# Patient Record
Sex: Male | Born: 1970 | Race: White | Hispanic: No | Marital: Single | Smoking: Never smoker
Health system: Southern US, Community
[De-identification: ages and names within clinical notes are randomized; demographics above are authoritative.]

## PROBLEM LIST (undated history)

## (undated) DIAGNOSIS — T7840XA Allergy, unspecified, initial encounter: Secondary | ICD-10-CM

## (undated) HISTORY — PX: APPENDECTOMY: SHX54

## (undated) HISTORY — PX: TIBIA FRACTURE SURGERY: SHX806

## (undated) HISTORY — PX: HERNIA REPAIR: SHX51

## (undated) HISTORY — DX: Allergy, unspecified, initial encounter: T78.40XA

## (undated) HISTORY — PX: SHOULDER SURGERY: SHX246

---

## 2003-01-17 ENCOUNTER — Encounter: Admission: RE | Admit: 2003-01-17 | Discharge: 2003-01-17 | Payer: Self-pay | Admitting: Family Medicine

## 2004-09-13 ENCOUNTER — Ambulatory Visit: Payer: Self-pay | Admitting: Family Medicine

## 2004-09-20 ENCOUNTER — Ambulatory Visit: Payer: Self-pay | Admitting: Family Medicine

## 2005-05-14 ENCOUNTER — Ambulatory Visit: Payer: Self-pay | Admitting: Family Medicine

## 2005-05-23 ENCOUNTER — Encounter: Admission: RE | Admit: 2005-05-23 | Discharge: 2005-05-23 | Payer: Self-pay | Admitting: Orthopedic Surgery

## 2007-02-13 DIAGNOSIS — S0083XA Contusion of other part of head, initial encounter: Secondary | ICD-10-CM

## 2007-02-13 DIAGNOSIS — S0003XA Contusion of scalp, initial encounter: Secondary | ICD-10-CM | POA: Insufficient documentation

## 2007-02-13 DIAGNOSIS — S1093XA Contusion of unspecified part of neck, initial encounter: Secondary | ICD-10-CM

## 2007-02-16 ENCOUNTER — Ambulatory Visit: Payer: Self-pay | Admitting: Family Medicine

## 2007-02-16 DIAGNOSIS — J309 Allergic rhinitis, unspecified: Secondary | ICD-10-CM | POA: Insufficient documentation

## 2008-08-24 ENCOUNTER — Encounter: Payer: Self-pay | Admitting: Family Medicine

## 2008-09-05 ENCOUNTER — Telehealth: Payer: Self-pay | Admitting: Family Medicine

## 2008-09-05 DIAGNOSIS — N469 Male infertility, unspecified: Secondary | ICD-10-CM | POA: Insufficient documentation

## 2009-06-14 ENCOUNTER — Ambulatory Visit: Payer: Self-pay | Admitting: Family Medicine

## 2009-06-14 DIAGNOSIS — H9319 Tinnitus, unspecified ear: Secondary | ICD-10-CM | POA: Insufficient documentation

## 2009-11-07 ENCOUNTER — Ambulatory Visit: Payer: Self-pay | Admitting: Family Medicine

## 2009-11-07 DIAGNOSIS — J45909 Unspecified asthma, uncomplicated: Secondary | ICD-10-CM | POA: Insufficient documentation

## 2010-01-27 ENCOUNTER — Encounter: Payer: Self-pay | Admitting: Family Medicine

## 2010-02-06 NOTE — Assessment & Plan Note (Signed)
Summary: ringing in rt ear/pressure/cjr   Vital Signs:  Patient profile:   40 year old male Weight:      165 pounds Temp:     98.8 degrees F oral BP sitting:   140 / 90  (left arm)  Vitals Entered By: Kathrynn Speed CMA (June 14, 2009 9:27 AM)   History of Present Illness: Troy Mercer is a 40 year old male, who comes in today for evaluation of ringing in his right ear.  He awoke at 4 a.m. this morning to urinate and noticed some ringing in his ear.  He went back to sleep woke up this morning, and the ringing persists.  He describes as a high-pitched noise.  He says his hearing is somewhat muffled but seems to be normal.  No history of previous hearing loss 10 at this trauma, etc.  Current Medications (verified): 1)  None  Allergies (verified): 1)  ! Pcn 2)  Codeine  Past History:  Past medical, surgical, family and social histories (including risk factors) reviewed for relevance to current acute and chronic problems.  Past Medical History: Reviewed history from 02/16/2007 and no changes required. Allergic rhinitis  Past Surgical History: Reviewed history from 02/16/2007 and no changes required. Appendectomy r  herniorrhaphy hx of spiralfracture- DISTAL L TIBIA  Family History: Reviewed history from 02/16/2007 and no changes required. Family History High cholesterol Family History Hypertension Family History of Alcoholism-SMOKING Family History Thyroidectomy DDD OBESE DIVERTICULITIS OA  Social History: Reviewed history and no changes required.  Review of Systems      See HPI  Physical Exam  General:  Well-developed,well-nourished,in no acute distress; alert,appropriate and cooperative throughout examination Ears:  External ear exam shows no significant lesions or deformities.  Otoscopic examination reveals clear canals, tympanic membranes are intact bilaterally without bulging, retraction, inflammation or discharge. Hearing is grossly normal  bilaterally.   Problems:  Medical Problems Added: 1)  Dx of Tinnitus, Right  (ICD-388.30)  Impression & Recommendations:  Problem # 1:  TINNITUS, RIGHT (ICD-388.30) Assessment New  Patient Instructions: 1)  if the tennitus does  not resolved in a couple days or u   noticed changing your hearing.  Call Dr. Narda Bonds immediately for consultation

## 2010-02-06 NOTE — Assessment & Plan Note (Signed)
Summary: chest cod//lch   Vital Signs:  Patient profile:   40 year old male Weight:      165 pounds Temp:     98.7 degrees F oral BP sitting:   120 / 84  (left arm) Cuff size:   regular  Vitals Entered By: Kern Reap CMA Duncan Dull) (November 07, 2009 10:54 AM) CC: chest congestion   CC:  chest congestion.  History of Present Illness: Troy Mercer is a 40 year old male, nonsmoker, who comes in today with a 8 day history of coughing.  He states he has allergic rhinitis and always has problems in the fall.  He has a lot of sensitivity to ragweed.  About 8 days ago he developed chest congestion and coughing.  He also feels like he is wheezing.  Review of systems otherwise negative  Allergies: 1)  ! Pcn 2)  Codeine  Past History:  Past medical, surgical, family and social histories (including risk factors) reviewed for relevance to current acute and chronic problems.  Past Medical History: Reviewed history from 02/16/2007 and no changes required. Allergic rhinitis  Past Surgical History: Reviewed history from 02/16/2007 and no changes required. Appendectomy r  herniorrhaphy hx of spiralfracture- DISTAL L TIBIA  Family History: Reviewed history from 02/16/2007 and no changes required. Family History High cholesterol Family History Hypertension Family History of Alcoholism-SMOKING Family History Thyroidectomy DDD OBESE DIVERTICULITIS OA  Social History: Reviewed history and no changes required.  Review of Systems      See HPI  Physical Exam  General:  Well-developed,well-nourished,in no acute distress; alert,appropriate and cooperative throughout examination Head:  Normocephalic and atraumatic without obvious abnormalities. No apparent alopecia or balding. Eyes:  No corneal or conjunctival inflammation noted. EOMI. Perrla. Funduscopic exam benign, without hemorrhages, exudates or papilledema. Vision grossly normal. Ears:  External ear exam shows no significant lesions or  deformities.  Otoscopic examination reveals clear canals, tympanic membranes are intact bilaterally without bulging, retraction, inflammation or discharge. Hearing is grossly normal bilaterally. Nose:  External nasal examination shows no deformity or inflammation. Nasal mucosa are pink and moist without lesions or exudates. Mouth:  Oral mucosa and oropharynx without lesions or exudates.  Teeth in good repair. Neck:  No deformities, masses, or tenderness noted. Chest Wall:  No deformities, masses, tenderness or gynecomastia noted. Lungs:  symmetrical breath sounds delayed expiratory wheezing on forced expiration   Problems:  Medical Problems Added: 1)  Dx of Asthma  (ICD-493.90)  Impression & Recommendations:  Problem # 1:  ASTHMA (ICD-493.90) Assessment New  His updated medication list for this problem includes:    Prednisone 20 Mg Tabs (Prednisone) ..... Uad  Complete Medication List: 1)  Prednisone 20 Mg Tabs (Prednisone) .... Uad  Patient Instructions: 1)  begin prednisone two tabs x 3 days, one x 3 days, a half x 3 days, then half a tablet Monday, Wednesday, Friday, for a two-week taper.  Return p.r.n. Prescriptions: PREDNISONE 20 MG TABS (PREDNISONE) UAD  #30 x 1   Entered and Authorized by:   Roderick Pee MD   Signed by:   Roderick Pee MD on 11/07/2009   Method used:   Print then Give to Patient   RxID:   [id number]    Orders Added: 1)  Est. Patient Level III [30865]

## 2010-02-06 NOTE — Progress Notes (Signed)
Summary: PLEASE ADVISE OF LABS  Phone Note Call from Patient Call back at Home Phone [phone]   Caller: Patient-LIVE CALL Reason for Call: Talk to Nurse Summary of Call: LABS WERE FAXED HERE FOR DR TODD TOLD TO VIEW. PLEASE ADVISE HIM OF WHAT HE NEEDS TO DO? WANTS RACHEL OR DR TODD TO CALL HIM TODAY. Initial call taken by: Warnell Forester,  September 05, 2008 9:51 AM  Follow-up for Phone Call        pls. 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 per dr todd Follow-up by: Kern Reap CMA Duncan Dull),  September 06, 2008 12:20 PM  Additional Follow-up for Phone Call Additional follow up Details #3:: Details for Additional Follow-up Action Taken: patient is aware Additional Follow-up by: Kern Reap CMA (AAMA),  September 06, 2008 12:21 PM  New Problems: MALE INFERTILITY (ICD-606.9)

## 2011-01-24 ENCOUNTER — Encounter: Payer: Self-pay | Admitting: Family Medicine

## 2011-01-24 ENCOUNTER — Ambulatory Visit (INDEPENDENT_AMBULATORY_CARE_PROVIDER_SITE_OTHER): Payer: BC Managed Care – PPO | Admitting: Family Medicine

## 2011-01-24 VITALS — BP 110/80 | Temp 98.2°F | Ht 68.0 in | Wt 165.0 lb

## 2011-01-24 DIAGNOSIS — K140 Glossitis: Secondary | ICD-10-CM

## 2011-01-24 LAB — CBC WITH DIFFERENTIAL/PLATELET
Basophils Absolute: 0 10*3/uL (ref 0.0–0.1)
Basophils Relative: 0.3 % (ref 0.0–3.0)
Eosinophils Absolute: 0.1 10*3/uL (ref 0.0–0.7)
Eosinophils Relative: 1.1 % (ref 0.0–5.0)
HCT: 39.2 % (ref 39.0–52.0)
Hemoglobin: 13.4 g/dL (ref 13.0–17.0)
Lymphocytes Relative: 20.3 % (ref 12.0–46.0)
Lymphs Abs: 1.5 10*3/uL (ref 0.7–4.0)
MCHC: 34.2 g/dL (ref 30.0–36.0)
MCV: 89.8 fl (ref 78.0–100.0)
Monocytes Absolute: 0.5 10*3/uL (ref 0.1–1.0)
Monocytes Relative: 6.8 % (ref 3.0–12.0)
Neutro Abs: 5.3 10*3/uL (ref 1.4–7.7)
Neutrophils Relative %: 71.5 % (ref 43.0–77.0)
Platelets: 208 10*3/uL (ref 150.0–400.0)
RBC: 4.36 Mil/uL (ref 4.22–5.81)
RDW: 13.3 % (ref 11.5–14.6)
WBC: 7.4 10*3/uL (ref 4.5–10.5)

## 2011-01-24 NOTE — Progress Notes (Signed)
  Subjective:    Patient ID: Troy Mercer, male    DOB: 1970/02/19, 41 y.o.   MRN: [id number]  HPI Troy Mercer is a 41 year old male, who comes in today for evaluation of irritation of his tongue for 4 to 6 weeks.  About 6 weeks ago.  He ate some spicy foods, and this time, was irritated for a week or so, which he expected to however, the irritation is gotten worse.  No fever, sore throat.  He does not smoke or chew.  Recently, has been taking Cleocin from the oral surgeon because of an infected tooth.  This is made the irritation of his tongue.  Worse.  Also, yesterday, he began having diarrhea.   Review of Systems    General an ENT review of systems otherwise negative.  Family history negative Objective:   Physical Exam  Well-developed well-nourished man in no acute distress.  Examination of the tongue shows it to be red and inflamed and irritated      Assessment & Plan:  Glossitis unknown etiology.  Plan ENT consult

## 2011-01-24 NOTE — Patient Instructions (Signed)
I will call you and I get the report on your blood work

## 2011-01-25 LAB — VITAMIN B12: Vitamin B-12: 681 pg/mL (ref 211–911)

## 2011-01-29 ENCOUNTER — Telehealth: Payer: Self-pay | Admitting: *Deleted

## 2011-01-29 NOTE — Telephone Encounter (Signed)
Requesting lab results

## 2011-01-30 NOTE — Telephone Encounter (Signed)
Left message on machine for patient with lab results 

## 2011-01-31 ENCOUNTER — Telehealth: Payer: Self-pay | Admitting: *Deleted

## 2011-01-31 DIAGNOSIS — R197 Diarrhea, unspecified: Secondary | ICD-10-CM

## 2011-01-31 NOTE — Telephone Encounter (Signed)
Fleet Contras please get him set up for a stool sample for Clostridium difficile.  Colitis,,,,,,,,,,,,, and find out who gave him the Cleocin

## 2011-01-31 NOTE — Telephone Encounter (Signed)
Lab ordered.  Left message on machine for patient  To return our call.

## 2011-01-31 NOTE — Telephone Encounter (Signed)
Patient still has diarrhea from the clindamycin. He has tried OTC with no relief.  Any suggestions?

## 2011-01-31 NOTE — Telephone Encounter (Signed)
Pt requesting to speak with nurse in regards to side effects he continues to have with taking clindamycin.

## 2011-02-01 NOTE — Telephone Encounter (Signed)
Left message on machine for patient

## 2011-02-19 ENCOUNTER — Ambulatory Visit (INDEPENDENT_AMBULATORY_CARE_PROVIDER_SITE_OTHER): Payer: BC Managed Care – PPO | Admitting: Family Medicine

## 2011-02-19 ENCOUNTER — Encounter: Payer: Self-pay | Admitting: Family Medicine

## 2011-02-19 VITALS — BP 120/80 | Temp 98.4°F | Wt 166.0 lb

## 2011-02-19 DIAGNOSIS — J029 Acute pharyngitis, unspecified: Secondary | ICD-10-CM

## 2011-02-19 LAB — POCT RAPID STREP A (OFFICE): Rapid Strep A Screen: NEGATIVE

## 2011-02-19 MED ORDER — CLARITHROMYCIN 500 MG PO TABS: ORAL_TABLET | ORAL | Status: DC

## 2011-02-19 NOTE — Progress Notes (Signed)
  Subjective:    Patient ID: Troy Mercer, male    DOB: 08/03/1970, 41 y.o.   MRN: [id number]  HPI Terrion is a 41 year old male who comes in today accompanied by his girlfriend for evaluation of a sore throat for 3 days  He's had a progressively increasing sore throat pain over the last 3 days also fever and chills no earache cough etc. etc. and he states he has a history of strep throat as a child   Review of Systems    general and ENT review of systems otherwise negative Objective:   Physical Exam  Well-developed well-nourished male in no acute distress HEENT negative except for exudative pharyngitis bilaterally  Rapid strep negative      Assessment & Plan:  Pharyngitis probable bacterial plan treat with erythromycin

## 2011-02-19 NOTE — Patient Instructions (Signed)
Tylenol aspirin normo-trimmed for fever,,,,,,,,,,,,,,,, Chloraseptic lozenges  Begin the antibiotic one twice daily for 10 days return when necessary

## 2011-02-21 ENCOUNTER — Telehealth: Payer: Self-pay | Admitting: *Deleted

## 2011-02-21 NOTE — Telephone Encounter (Signed)
Troy Mercer please call he is on antibiotics have him gargle with warm salt water 3 or 4 times daily for the irritated throat

## 2011-02-21 NOTE — Telephone Encounter (Signed)
Pt is feeling worse with low grade fever, and his entire throat is covered with white patches.  Asking if he should give it longer, or come back for office visit?

## 2011-02-21 NOTE — Telephone Encounter (Signed)
Spoke with patient.

## 2012-03-18 ENCOUNTER — Emergency Department (HOSPITAL_BASED_OUTPATIENT_CLINIC_OR_DEPARTMENT_OTHER): Payer: BC Managed Care – PPO

## 2012-03-18 ENCOUNTER — Emergency Department (HOSPITAL_BASED_OUTPATIENT_CLINIC_OR_DEPARTMENT_OTHER)
Admission: EM | Admit: 2012-03-18 | Discharge: 2012-03-18 | Disposition: A | Discharge status: Home / Self Care | Payer: BC Managed Care – PPO | Attending: Emergency Medicine | Admitting: Emergency Medicine

## 2012-03-18 ENCOUNTER — Other Ambulatory Visit: Payer: Self-pay

## 2012-03-18 ENCOUNTER — Encounter (HOSPITAL_BASED_OUTPATIENT_CLINIC_OR_DEPARTMENT_OTHER): Payer: Self-pay | Admitting: *Deleted

## 2012-03-18 DIAGNOSIS — Z7982 Long term (current) use of aspirin: Secondary | ICD-10-CM | POA: Insufficient documentation

## 2012-03-18 DIAGNOSIS — Z8249 Family history of ischemic heart disease and other diseases of the circulatory system: Secondary | ICD-10-CM | POA: Insufficient documentation

## 2012-03-18 DIAGNOSIS — Z9089 Acquired absence of other organs: Secondary | ICD-10-CM | POA: Insufficient documentation

## 2012-03-18 DIAGNOSIS — Z79899 Other long term (current) drug therapy: Secondary | ICD-10-CM | POA: Insufficient documentation

## 2012-03-18 DIAGNOSIS — R1013 Epigastric pain: Secondary | ICD-10-CM | POA: Insufficient documentation

## 2012-03-18 DIAGNOSIS — Z9889 Other specified postprocedural states: Secondary | ICD-10-CM | POA: Insufficient documentation

## 2012-03-18 LAB — CBC WITH DIFFERENTIAL/PLATELET
Basophils Absolute: 0 10*3/uL (ref 0.0–0.1)
Basophils Relative: 0 % (ref 0–1)
Eosinophils Absolute: 0.1 10*3/uL (ref 0.0–0.7)
Eosinophils Relative: 2 % (ref 0–5)
HCT: 41.4 % (ref 39.0–52.0)
Hemoglobin: 14.4 g/dL (ref 13.0–17.0)
Lymphocytes Relative: 20 % (ref 12–46)
Lymphs Abs: 1.1 10*3/uL (ref 0.7–4.0)
MCH: 30 pg (ref 26.0–34.0)
MCHC: 34.8 g/dL (ref 30.0–36.0)
MCV: 86.3 fL (ref 78.0–100.0)
Monocytes Absolute: 0.5 10*3/uL (ref 0.1–1.0)
Monocytes Relative: 9 % (ref 3–12)
Neutro Abs: 3.5 10*3/uL (ref 1.7–7.7)
Neutrophils Relative %: 68 % (ref 43–77)
Platelets: 174 10*3/uL (ref 150–400)
RBC: 4.8 MIL/uL (ref 4.22–5.81)
RDW: 12.7 % (ref 11.5–15.5)
WBC: 5.2 10*3/uL (ref 4.0–10.5)

## 2012-03-18 LAB — BASIC METABOLIC PANEL
BUN: 14 mg/dL (ref 6–23)
CO2: 28 mEq/L (ref 19–32)
Calcium: 9.8 mg/dL (ref 8.4–10.5)
Chloride: 103 mEq/L (ref 96–112)
Creatinine, Ser: 1.1 mg/dL (ref 0.50–1.35)
GFR calc Af Amer: >90 mL/min (ref >90)
GFR calc non Af Amer: 83 mL/min — ABNORMAL LOW (ref >90)
Glucose, Bld: 122 mg/dL — ABNORMAL HIGH (ref 70–99)
Potassium: 3.8 mEq/L (ref 3.5–5.1)
Sodium: 140 mEq/L (ref 135–145)

## 2012-03-18 LAB — TROPONIN I: Troponin I: <0.3 ng/mL (ref <0.30)

## 2012-03-18 MED ORDER — NITROGLYCERIN 0.4 MG SL SUBL: 0.4000 mg | SUBLINGUAL_TABLET | SUBLINGUAL | Status: DC | PRN

## 2012-03-18 MED ORDER — NITROGLYCERIN 0.4 MG SL SUBL
0.4000 mg | SUBLINGUAL_TABLET | SUBLINGUAL | Status: DC | PRN
  Administered 2012-03-18: 0.4 mg via SUBLINGUAL
  Filled 2012-03-18: qty 25

## 2012-03-18 MED ORDER — ASPIRIN 81 MG PO CHEW
162.0000 mg | CHEWABLE_TABLET | Freq: Once | ORAL | Status: AC
  Administered 2012-03-18: 162 mg via ORAL
  Filled 2012-03-18: qty 2

## 2012-03-18 MED ORDER — ASPIRIN 81 MG PO CHEW: 81.0000 mg | CHEWABLE_TABLET | Freq: Every day | ORAL | Status: DC

## 2012-03-18 NOTE — ED Notes (Signed)
Patient transported to X-ray 

## 2012-03-18 NOTE — ED Notes (Signed)
Pt returned from radiology.

## 2012-03-18 NOTE — ED Notes (Signed)
Patient states he developed epigastric burning pain yesterday around 1700.  States he has taken OTC zantac and tums thru out the night with no relief.  Concerned because he has a strong family history of early heart disease.  States he took 2 chewable aspirin pta.

## 2012-03-18 NOTE — ED Provider Notes (Signed)
History     CSN: [id number]  Arrival date & time 03/18/12  1308   First MD Initiated Contact with Patient 03/18/12 [phone]      Chief Complaint  Patient presents with  . Chest Pain    (Consider location/radiation/quality/duration/timing/severity/associated sxs/prior treatment) HPI Pt with boring epigastric pain that started yesterday at 1700 and has been persistent throughout the night. Took zantac and tums without relief. No N/V/D. No SOB, or cough. No radiation of pain. Pt with father had MI in 33's. Does not smoke and has no personal history of HTN, hypercholesterolemia.  Past Medical History  Diagnosis Date  . Allergy     Past Surgical History  Procedure Laterality Date  . Appendectomy    . Hernia repair    . Tibia fracture surgery      left - spiral  . Shoulder surgery Right     with bicep tendon repair     Family History  Problem Relation Age of Onset  . Hyperlipidemia Other   . Hypertension Other   . Alcohol abuse Other   . Diverticulitis Other   . Arthritis Other     History  Substance Use Topics  . Smoking status: Never Smoker   . Smokeless tobacco: Never Used  . Alcohol Use: Yes     Comment: occassionally      Review of Systems  Constitutional: Negative for fever, chills and diaphoresis.  Respiratory: Negative for chest tightness and shortness of breath.   Cardiovascular: Negative for chest pain.  Gastrointestinal: Positive for abdominal pain. Negative for nausea, vomiting, diarrhea and constipation.  Musculoskeletal: Negative for back pain.  Skin: Negative for rash.  Neurological: Negative for weakness, light-headedness and numbness.  All other systems reviewed and are negative.    Allergies  Codeine and Penicillins  Home Medications   Current Outpatient Rx  Name  Route  Sig  Dispense  Refill  . aspirin EC 81 MG tablet   Oral   Take 162 mg by mouth daily as needed for pain.         . calcium carbonate (TUMS - DOSED IN MG ELEMENTAL  CALCIUM) 500 MG chewable tablet   Oral   Chew 2 tablets by mouth daily as needed for heartburn.         . cetirizine (ZYRTEC) 10 MG tablet   Oral   Take 10 mg by mouth as needed for allergies.         . ranitidine (ZANTAC) 75 MG tablet   Oral   Take 75 mg by mouth daily as needed for heartburn.         Marland Kitchen aspirin 81 MG chewable tablet   Oral   Chew 1 tablet (81 mg total) by mouth daily.   30 tablet   0   . clarithromycin (BIAXIN) 500 MG tablet      1 by mouth twice a day for 10 days   20 tablet   1   . nitroGLYCERIN (NITROSTAT) 0.4 MG SL tablet   Sublingual   Place 1 tablet (0.4 mg total) under the tongue every 5 (five) minutes as needed for chest pain.   30 tablet   0     BP 116/74  Pulse 82  Temp(Src) 98.6 F (37 C) (Oral)  Resp 18  Ht 5\' 8"  (1.727 m)  Wt 162 lb (73.483 kg)  BMI 24.64 kg/m2  SpO2 97%  Physical Exam  Nursing note and vitals reviewed. Constitutional: He is oriented to person, place,  and time. He appears well-developed and well-nourished. No distress.  Pt sitting comfortably in bed  HENT:  Head: Normocephalic and atraumatic.  Mouth/Throat: Oropharynx is clear and moist.  Eyes: EOM are normal. Pupils are equal, round, and reactive to light.  Neck: Normal range of motion. Neck supple.  Cardiovascular: Normal rate and regular rhythm.  Exam reveals no gallop and no friction rub.   No murmur heard. Pulmonary/Chest: Effort normal and breath sounds normal. No respiratory distress. He has no wheezes. He has no rales. He exhibits no tenderness.  Abdominal: Soft. Bowel sounds are normal. He exhibits no distension and no mass. There is no tenderness. There is no rebound and no guarding.  Pt with point to the area of pain over the xyphoid process and epigastrium. Unable to replicate with palpation  Musculoskeletal: Normal range of motion. He exhibits no edema and no tenderness.  No calf swelling or tenderness  Neurological: He is alert and oriented  to person, place, and time.  5/5 motor in all ext, sensation intact  Skin: Skin is warm and dry. No rash noted. No erythema.  Psychiatric: He has a normal mood and affect. His behavior is normal.    ED Course  Procedures (including critical care time)  Labs Reviewed  BASIC METABOLIC PANEL - Abnormal; Notable for the following:    Glucose, Bld 122 (*)    GFR calc non Af Amer 83 (*)    All other components within normal limits  CBC WITH DIFFERENTIAL  TROPONIN I   Dg Chest 2 View  03/18/2012  *RADIOLOGY REPORT*  Clinical Data: Chest pain  CHEST - 2 VIEW  Comparison: None.  Findings:  Lungs clear.  Heart size and pulmonary vascularity are normal.  No adenopathy.  No bone lesions.  No pneumothorax.  IMPRESSION: No abnormality noted.   Original Report Authenticated By: Bretta Bang, M.D.      1. Epigastric pain      Date: 03/18/2012  Rate: 64  Rhythm: normal sinus rhythm  QRS Axis: normal  Intervals: normal  ST/T Wave abnormalities: normal  Conduction Disutrbances:none  Narrative Interpretation:   Old EKG Reviewed: none available    MDM  Pt with negative work up. Acute MI ruled out with single negative Trop due to the constant nature of pain and duration >12 hours. Normal EKG. Pain was mostly relieved after NTG. Suspect esophogeal origin of pain, possible esophogeal spasm.   Pt is low risk for CAD but does have family history of MI. Discussed with Dr Patty Sermons who suggested starting pt on low dose aspirin and giving NTG prn. Pt is to f/u with cardiology, though I believe this is precautionary. Pt advised to return immediately for acute worsening of pain or any concerns          Loren Racer, MD 03/18/12 1105

## 2012-03-22 ENCOUNTER — Emergency Department (HOSPITAL_COMMUNITY): Payer: BC Managed Care – PPO

## 2012-03-22 ENCOUNTER — Encounter (HOSPITAL_COMMUNITY): Payer: Self-pay

## 2012-03-22 ENCOUNTER — Other Ambulatory Visit: Payer: Self-pay

## 2012-03-22 ENCOUNTER — Emergency Department (HOSPITAL_COMMUNITY)
Admission: EM | Admit: 2012-03-22 | Discharge: 2012-03-23 | Disposition: A | Discharge status: Home / Self Care | Payer: BC Managed Care – PPO | Attending: Emergency Medicine | Admitting: Emergency Medicine

## 2012-03-22 DIAGNOSIS — Z9089 Acquired absence of other organs: Secondary | ICD-10-CM | POA: Insufficient documentation

## 2012-03-22 DIAGNOSIS — Z79899 Other long term (current) drug therapy: Secondary | ICD-10-CM | POA: Insufficient documentation

## 2012-03-22 DIAGNOSIS — Z9889 Other specified postprocedural states: Secondary | ICD-10-CM | POA: Insufficient documentation

## 2012-03-22 DIAGNOSIS — R109 Unspecified abdominal pain: Secondary | ICD-10-CM

## 2012-03-22 DIAGNOSIS — R1013 Epigastric pain: Secondary | ICD-10-CM | POA: Insufficient documentation

## 2012-03-22 LAB — CBC WITH DIFFERENTIAL/PLATELET
Basophils Absolute: 0 10*3/uL (ref 0.0–0.1)
Basophils Relative: 0 % (ref 0–1)
Eosinophils Absolute: 0.1 10*3/uL (ref 0.0–0.7)
Eosinophils Relative: 2 % (ref 0–5)
HCT: 39.8 % (ref 39.0–52.0)
Hemoglobin: 14.1 g/dL (ref 13.0–17.0)
Lymphocytes Relative: 20 % (ref 12–46)
Lymphs Abs: 1.4 10*3/uL (ref 0.7–4.0)
MCH: 30.3 pg (ref 26.0–34.0)
MCHC: 35.4 g/dL (ref 30.0–36.0)
MCV: 85.6 fL (ref 78.0–100.0)
Monocytes Absolute: 0.7 10*3/uL (ref 0.1–1.0)
Monocytes Relative: 10 % (ref 3–12)
Neutro Abs: 4.7 10*3/uL (ref 1.7–7.7)
Neutrophils Relative %: 68 % (ref 43–77)
Platelets: 171 10*3/uL (ref 150–400)
RBC: 4.65 MIL/uL (ref 4.22–5.81)
RDW: 12.8 % (ref 11.5–15.5)
WBC: 6.9 10*3/uL (ref 4.0–10.5)

## 2012-03-22 LAB — URINALYSIS, MICROSCOPIC ONLY
Bilirubin Urine: NEGATIVE
Glucose, UA: NEGATIVE mg/dL
Hgb urine dipstick: NEGATIVE
Ketones, ur: NEGATIVE mg/dL
Leukocytes, UA: NEGATIVE
Nitrite: NEGATIVE
Protein, ur: NEGATIVE mg/dL
Specific Gravity, Urine: 1.026 (ref 1.005–1.030)
Urobilinogen, UA: 1 mg/dL (ref 0.0–1.0)
pH: 6.5 (ref 5.0–8.0)

## 2012-03-22 LAB — COMPREHENSIVE METABOLIC PANEL
ALT: 23 U/L (ref 0–53)
AST: 22 U/L (ref 0–37)
Albumin: 4.4 g/dL (ref 3.5–5.2)
Alkaline Phosphatase: 62 U/L (ref 39–117)
BUN: 13 mg/dL (ref 6–23)
CO2: 24 mEq/L (ref 19–32)
Calcium: 10 mg/dL (ref 8.4–10.5)
Chloride: 104 mEq/L (ref 96–112)
Creatinine, Ser: 0.94 mg/dL (ref 0.50–1.35)
GFR calc Af Amer: >90 mL/min (ref >90)
GFR calc non Af Amer: >90 mL/min (ref >90)
Glucose, Bld: 100 mg/dL — ABNORMAL HIGH (ref 70–99)
Potassium: 4.3 mEq/L (ref 3.5–5.1)
Sodium: 139 mEq/L (ref 135–145)
Total Bilirubin: 0.8 mg/dL (ref 0.3–1.2)
Total Protein: 7.5 g/dL (ref 6.0–8.3)

## 2012-03-22 LAB — LIPASE, BLOOD: Lipase: 25 U/L (ref 11–59)

## 2012-03-22 LAB — POCT I-STAT TROPONIN I: Troponin i, poc: 0 ng/mL (ref 0.00–0.08)

## 2012-03-22 MED ORDER — OXYCODONE-ACETAMINOPHEN 5-325 MG PO TABS: 2.0000 | ORAL_TABLET | ORAL | Status: DC | PRN

## 2012-03-22 MED ORDER — SODIUM CHLORIDE 0.9 % IV SOLN
80.0000 mg | Freq: Once | INTRAVENOUS | Status: AC
  Administered 2012-03-22: 80 mg via INTRAVENOUS
  Filled 2012-03-22: qty 80

## 2012-03-22 MED ORDER — HYDROMORPHONE HCL PF 1 MG/ML IJ SOLN
1.0000 mg | Freq: Once | INTRAMUSCULAR | Status: AC
  Administered 2012-03-22: 1 mg via INTRAVENOUS
  Filled 2012-03-22: qty 1

## 2012-03-22 MED ORDER — OMEPRAZOLE 20 MG PO CPDR: 40.0000 mg | DELAYED_RELEASE_CAPSULE | Freq: Every day | ORAL | Status: DC

## 2012-03-22 MED ORDER — FAMOTIDINE 20 MG PO TABS: 20.0000 mg | ORAL_TABLET | Freq: Two times a day (BID) | ORAL | Status: DC

## 2012-03-22 MED ORDER — FAMOTIDINE IN NACL 20-0.9 MG/50ML-% IV SOLN
20.0000 mg | Freq: Once | INTRAVENOUS | Status: AC
  Administered 2012-03-22: 20 mg via INTRAVENOUS
  Filled 2012-03-22: qty 50

## 2012-03-22 MED ORDER — GI COCKTAIL ~~LOC~~
30.0000 mL | Freq: Once | ORAL | Status: AC
  Administered 2012-03-22: 30 mL via ORAL
  Filled 2012-03-22: qty 30

## 2012-03-22 NOTE — ED Provider Notes (Signed)
History     CSN: [id number]  Arrival date & time 03/22/12  1949   First MD Initiated Contact with Patient 03/22/12 2013      Chief Complaint  Patient presents with  . Abdominal Pain    (Consider location/radiation/quality/duration/timing/severity/associated sxs/prior treatment) HPI This 42 year old male presents with over 12 hours of constant epigastric abdominal pain gradually worsening over the last few hours, he is no associated symptoms including no nausea vomiting diarrhea or bloody stools, he is no testicular pain or dysuria, he has no chest pain no cough no shortness of breath no fever, treatment prior to arrival consisted of an aspirin this morning as well as 5 sublingual nitroglycerin tablets without improvement, he also tried an over-the-counter antacid 4 times today without improvement, he was seen in the emergency department within the last week for the same symptoms which have resolved for a couple of days until it recurred last night lasting until the present time. His pain is gradual onset was mild at onset but is now severe worse with palpation movement and better he stays still and is nonexertional and nonpleuritic constant and burning. Past Medical History  Diagnosis Date  . Allergy     Past Surgical History  Procedure Laterality Date  . Appendectomy    . Hernia repair    . Tibia fracture surgery      left - spiral  . Shoulder surgery Right     with bicep tendon repair     Family History  Problem Relation Age of Onset  . Hyperlipidemia Other   . Hypertension Other   . Alcohol abuse Other   . Diverticulitis Other   . Arthritis Other     History  Substance Use Topics  . Smoking status: Never Smoker   . Smokeless tobacco: Never Used  . Alcohol Use: Yes     Comment: occassionally      Review of Systems 10 Systems reviewed and are negative for acute change except as noted in the HPI. Allergies  Codeine and Penicillins  Home Medications   Current  Outpatient Rx  Name  Route  Sig  Dispense  Refill  . calcium carbonate (TUMS - DOSED IN MG ELEMENTAL CALCIUM) 500 MG chewable tablet   Oral   Chew 2 tablets by mouth 2 (two) times daily as needed for heartburn.          . naproxen sodium (ANAPROX) 220 MG tablet   Oral   Take 440 mg by mouth 2 (two) times daily as needed (for pain).         . nitroGLYCERIN (NITROSTAT) 0.4 MG SL tablet   Sublingual   Place 1 tablet (0.4 mg total) under the tongue every 5 (five) minutes as needed for chest pain.   30 tablet   0   . ranitidine (ZANTAC) 75 MG tablet   Oral   Take 75-150 mg by mouth daily as needed for heartburn.          . famotidine (PEPCID) 20 MG tablet   Oral   Take 1 tablet (20 mg total) by mouth 2 (two) times daily.   10 tablet   0   . omeprazole (PRILOSEC) 40 MG capsule   Oral   Take 1 capsule (40 mg total) by mouth daily.   30 capsule   3   . oxyCODONE-acetaminophen (PERCOCET) 5-325 MG per tablet   Oral   Take 2 tablets by mouth every 4 (four) hours as needed for pain.  10 tablet   0     BP 126/76  Pulse 49  Temp(Src) 98.3 F (36.8 C) (Oral)  Resp 14  SpO2 99%  Physical Exam  Nursing note and vitals reviewed. Constitutional:  Awake, alert, nontoxic appearance.  HENT:  Head: Atraumatic.  Eyes: Right eye exhibits no discharge. Left eye exhibits no discharge.  Neck: Neck supple.  Cardiovascular: Normal rate and regular rhythm.   No murmur heard. Pulmonary/Chest: Effort normal and breath sounds normal. No respiratory distress. He has no wheezes. He has no rales. He exhibits no tenderness.  Abdominal: Soft. Bowel sounds are normal. He exhibits no distension and no mass. There is tenderness. There is guarding. There is no rebound.  The patient is mild to moderately tender in the epigastrium only the remainder of the abdomen is nontender he is no rebound  Genitourinary:  Testicles are descended and nontender he has no palpable inguinal hernias   Musculoskeletal: He exhibits no tenderness.  Baseline ROM, no obvious new focal weakness.  Neurological:  Mental status and motor strength appears baseline for patient and situation.  Skin: No rash noted.  Psychiatric: He has a normal mood and affect.    ED Course  Procedures (including critical care time) ECG: Normal sinus rhythm, ventricular rate 65, normal axis, normal intervals, no acute ischemic changes noted, no significant change compared with 03/18/2012  Pain-free in ED after unremarkable labs awaiting ultrasound. Labs Reviewed  COMPREHENSIVE METABOLIC PANEL - Abnormal; Notable for the following:    Glucose, Bld 100 (*)    All other components within normal limits  URINALYSIS, MICROSCOPIC ONLY  LIPASE, BLOOD  CBC WITH DIFFERENTIAL  POCT I-STAT TROPONIN I   US Abdomen Complete  03/22/2012  *RADIOLOGY REPORT*  Clinical Data:  Epigastric abdominal pain.  COMPLETE ABDOMINAL ULTRASOUND  Comparison:  None.  Findings:  Gallbladder:  No shadowing gallstones or echogenic sludge.  No gallbladder wall thickening or pericholecystic fluid.  Negative sonographic Murphy's sign according to the ultrasound technologist.  Common bile duct:  Normal in caliber with maximum diameter approximating 2-3 mm.  Liver:  Normal size and echotexture without focal parenchymal abnormality.  Patent portal vein with hepatopetal flow.  IVC:  Patent.  Pancreas:  Normal size and echotexture without focal parenchymal abnormality.  Spleen:  Normal size and echotexture without focal parenchymal abnormality.  Right Kidney:  No hydronephrosis.  Well-preserved cortex.  No shadowing calculi.  Normal size and parenchymal echotexture without focal abnormalities.  Approximately 10.3 cm in length.  Left Kidney:  No hydronephrosis.  Well-preserved cortex.  No shadowing calculi.  Normal size and parenchymal echotexture without focal abnormalities.  Approximately 11.7 cm in length.  Abdominal aorta:  Normal in caliber throughout its  visualized course in the abdomen without significant atherosclerosis.  IMPRESSION: Normal examination.   Original Report Authenticated By: Hulan Saas, M.D.    Dg Abd Acute W/chest  03/22/2012  *RADIOLOGY REPORT*  Clinical Data: Epigastric pain.  Chest pain earlier this week.  ACUTE ABDOMEN SERIES (ABDOMEN 2 VIEW & CHEST 1 VIEW)  Comparison: 03/18/2012  Findings: The heart size and pulmonary vascularity are normal. The lungs appear clear and expanded without focal air space disease or consolidation. No blunting of the costophrenic angles.  No pneumothorax.  Mediastinal contours appear intact.  No significant change since previous chest radiograph.  Gas and stool throughout the colon.  No small or large bowel distension.  Mild prominence of gas filled nondistended small bowel in the mid abdomen could represent localized ileus or  gastroenteritis.  No free intra-abdominal air.  No abnormal air fluid levels.  Calcified phleboliths in the pelvis.  No radiopaque stones.  Visualized bones appear intact.  IMPRESSION: No evidence of active pulmonary disease.  Nonobstructive bowel gas pattern with mild prominence of nondilated gas-filled small bowel in the mid abdomen which could represent localized ileus versus gastroenteritis.   Original Report Authenticated By: Burman Nieves, M.D.      1. Abdominal pain       MDM  Patient / Family / Caregiver informed of clinical course, understand medical decision-making process, and agree with plan.  I doubt any other EMC precluding discharge at this time including, but not necessarily limited to the following:ACS, SBI.        Hurman Horn, MD 03/23/12 [phone]

## 2012-03-22 NOTE — ED Notes (Signed)
Patient presents with c/o epigastric pain. Began last night and has progressively gotten worse. Describes pain initially as a dull ache, 6/10. Now 8/10 and described as a "hard aching" pain. Patient was seen on 03/18/12 for the same. Pain had resolved for about 2 days after this visit and reoccurred last night. Patient had negative cardiac work up and was discharged home with Rx for NTG and low dose ASA. Due to strong CAD family hx, patient is to f/u with cardiology and has an appt on Wednesday (03/25/12). Denies SOB, headache, N/V, constipation or diarrhea. Has taken Tums x 4 and NTG x 5 today with no relief of pain. Endorses minor dizziness after NTG but none now.

## 2012-03-23 ENCOUNTER — Encounter: Payer: Self-pay | Admitting: Family

## 2012-03-23 ENCOUNTER — Ambulatory Visit (INDEPENDENT_AMBULATORY_CARE_PROVIDER_SITE_OTHER): Payer: BC Managed Care – PPO | Admitting: Family

## 2012-03-23 ENCOUNTER — Telehealth: Payer: Self-pay | Admitting: Family

## 2012-03-23 VITALS — BP 120/62 | HR 78 | Wt 164.0 lb

## 2012-03-23 DIAGNOSIS — R1013 Epigastric pain: Secondary | ICD-10-CM

## 2012-03-23 DIAGNOSIS — K219 Gastro-esophageal reflux disease without esophagitis: Secondary | ICD-10-CM

## 2012-03-23 MED ORDER — OMEPRAZOLE 40 MG PO CPDR: 40.0000 mg | DELAYED_RELEASE_CAPSULE | Freq: Every day | ORAL | Status: DC

## 2012-03-23 NOTE — Patient Instructions (Addendum)
Diet for Gastroesophageal Reflux Disease, Adult  Reflux (acid reflux) is when acid from your stomach flows up into the esophagus. When acid comes in contact with the esophagus, the acid causes irritation and soreness (inflammation) in the esophagus. When reflux happens often or so severely that it causes damage to the esophagus, it is called gastroesophageal reflux disease (GERD). Nutrition therapy can help ease the discomfort of GERD.  FOODS OR DRINKS TO AVOID OR LIMIT   Smoking or chewing tobacco. Nicotine is one of the most potent stimulants to acid production in the gastrointestinal tract.   Caffeinated and decaffeinated coffee and black tea.   Regular or low-calorie carbonated beverages or energy drinks (caffeine-free carbonated beverages are allowed).    Strong spices, such as black pepper, white pepper, red pepper, cayenne, curry powder, and chili powder.   Peppermint or spearmint.   Chocolate.   High-fat foods, including meats and fried foods. Extra added fats including oils, butter, salad dressings, and nuts. Limit these to less than 8 tsp per day.   Fruits and vegetables if they are not tolerated, such as citrus fruits or tomatoes.   Alcohol.   Any food that seems to aggravate your condition.  If you have questions regarding your diet, call your caregiver or a registered dietitian.  OTHER THINGS THAT MAY HELP GERD INCLUDE:    Eating your meals slowly, in a relaxed setting.   Eating 5 to 6 small meals per day instead of 3 large meals.   Eliminating food for a period of time if it causes distress.   Not lying down until 3 hours after eating a meal.   Keeping the head of your bed raised 6 to 9 inches (15 to 23 cm) by using a foam wedge or blocks under the legs of the bed. Lying flat may make symptoms worse.   Being physically active. Weight loss may be helpful in reducing reflux in overweight or obese adults.   Wear loose fitting clothing  EXAMPLE MEAL PLAN  This meal plan is approximately  2,000 calories based on [employer] meal planning guidelines.  Breakfast    cup cooked oatmeal.   1 cup strawberries.   1 cup low-fat milk.   1 oz almonds.  Snack   1 cup cucumber slices.   6 oz yogurt (made from low-fat or fat-free milk).  Lunch   2 slice whole-wheat bread.   2 oz sliced turkey.   2 tsp mayonnaise.   1 cup blueberries.   1 cup snap peas.  Snack   6 whole-wheat crackers.   1 oz string cheese.  Dinner    cup brown rice.   1 cup mixed veggies.   1 tsp olive oil.   3 oz grilled fish.  Document Released: 12/24/2004 Document Revised: 03/18/2011 Document Reviewed: 11/09/2010  [employer] Patient Information 2013 [employer].

## 2012-03-23 NOTE — Progress Notes (Signed)
Subjective:    Patient ID: Troy Mercer, male    DOB: 1970/11/27, 42 y.o.   MRN: [id number]  HPI 42 year old white male, nonsmoker, patient of Dr. Tawanna Cooler is in as an emergency department followup. Patient reports being seen in the emergency department on yesterday with complaints of chest pain. He had a full cardiac workup that was negative. Patient had previous symptoms similarly to this and had a cardiac workup at the emergency department that again was negative. Reports being given nitroglycerin in the past and took one on Saturday that he thinks may have helped. However, when the symptoms returned on Sunday, he was ineffective. Reports getting a GI cocktail at the emergency department that did help. It was determined, that he was having esophageal spasms or GERD. He was prescribed Prilosec 20 mg once daily that he's not began to take. Reports her diet is high in spicy foods, moderate caffeine intake. Denies any increase in stress in his life.   Review of Systems  Constitutional: Negative.   HENT: Negative.   Respiratory: Negative.   Cardiovascular: Positive for chest pain. Negative for palpitations and leg swelling.  Gastrointestinal: Positive for abdominal pain. Negative for vomiting, diarrhea and constipation.  Endocrine: Negative.   Genitourinary: Negative.   Musculoskeletal: Negative.   Skin: Negative.   Hematological: Negative.   Psychiatric/Behavioral: Negative.    Past Medical History  Diagnosis Date  . Allergy     History   Social History  . Marital Status: Married    Spouse Name: N/A    Number of Children: N/A  . Years of Education: N/A   Occupational History  . Not on file.   Social History Main Topics  . Smoking status: Never Smoker   . Smokeless tobacco: Never Used  . Alcohol Use: Yes     Comment: occassionally  . Drug Use: No  . Sexually Active: Not on file   Other Topics Concern  . Not on file   Social History Narrative  . No narrative on file     Past Surgical History  Procedure Laterality Date  . Appendectomy    . Hernia repair    . Tibia fracture surgery      left - spiral  . Shoulder surgery Right     with bicep tendon repair     Family History  Problem Relation Age of Onset  . Hyperlipidemia Other   . Hypertension Other   . Alcohol abuse Other   . Diverticulitis Other   . Arthritis Other     Allergies  Allergen Reactions  . Codeine Nausea And Vomiting  . Penicillins Itching and Rash    Current Outpatient Prescriptions on File Prior to Visit  Medication Sig Dispense Refill  . calcium carbonate (TUMS - DOSED IN MG ELEMENTAL CALCIUM) 500 MG chewable tablet Chew 2 tablets by mouth 2 (two) times daily as needed for heartburn.       . famotidine (PEPCID) 20 MG tablet Take 1 tablet (20 mg total) by mouth 2 (two) times daily.  10 tablet  0  . naproxen sodium (ANAPROX) 220 MG tablet Take 440 mg by mouth 2 (two) times daily as needed (for pain).      . nitroGLYCERIN (NITROSTAT) 0.4 MG SL tablet Place 1 tablet (0.4 mg total) under the tongue every 5 (five) minutes as needed for chest pain.  30 tablet  0  . oxyCODONE-acetaminophen (PERCOCET) 5-325 MG per tablet Take 2 tablets by mouth every 4 (four) hours as needed  for pain.  10 tablet  0  . ranitidine (ZANTAC) 75 MG tablet Take 75-150 mg by mouth daily as needed for heartburn.        No current facility-administered medications on file prior to visit.    BP 120/62  Pulse 78  Wt 164 lb (74.39 kg)  BMI 24.94 kg/m2  SpO2 98%chart    Objective:   Physical Exam  Constitutional: He is oriented to person, place, and time. He appears well-developed and well-nourished.  HENT:  Right Ear: External ear normal.  Left Ear: External ear normal.  Mouth/Throat: Oropharynx is clear and moist.  Neck: Normal range of motion. Neck supple.  Cardiovascular: Normal rate, regular rhythm and normal heart sounds.   Pulmonary/Chest: Effort normal and breath sounds normal.  Abdominal:  Soft. There is tenderness.  Epigastric region  Musculoskeletal: Normal range of motion.  Neurological: He is alert and oriented to person, place, and time.  Skin: Skin is warm and dry.  Psychiatric: He has a normal mood and affect.          Assessment & Plan:  Assessment:  1. GERD 2. Epigastric pain  Plan: Start omeprazole 40 mg once daily. GERD precautions given. GERD friendly diet. Refer to GI for possible endoscopy. Recheck here in 2 weeks and sooner as needed.

## 2012-03-23 NOTE — Telephone Encounter (Signed)
Pt calling to ask if you could assist in getting into GI doc soner. They cannot see him until April 2, and pt cannot wait that long.  Taking pain pills just to sleep at nite .GI office says this needs to be a stat order for them to get him in any earlier.  Pls advise

## 2012-03-24 NOTE — Telephone Encounter (Signed)
Pt called again requesting an Rx for percocet for GERD to help sleep.  Pt was seen in office and dx with GERD, given instructions on GERD management, prilosec and a GI referral.  Rx for percocet denied by [facility], as percocet is not indicated for GERD tx. Pt states that he needs the percocet to relieve the pain so that he can sleep at night. Advised pt to continue with the prilosec and await his GI appt on April 2. Also advised pt that Oran Rein does not feel like he needs to be seen by GI and sooner than 04/08/12 and that he should follow the instructions given to him in the office to help alleviate his pain. Although unhappy with the outcome of the call, pt said ok

## 2012-03-25 ENCOUNTER — Encounter: Payer: BC Managed Care – PPO | Admitting: Cardiology

## 2012-03-25 ENCOUNTER — Other Ambulatory Visit: Payer: Self-pay | Admitting: Gastroenterology

## 2012-03-25 DIAGNOSIS — R1013 Epigastric pain: Secondary | ICD-10-CM

## 2012-03-26 ENCOUNTER — Other Ambulatory Visit: Payer: BC Managed Care – PPO

## 2012-03-26 ENCOUNTER — Ambulatory Visit
Admission: RE | Admit: 2012-03-26 | Discharge: 2012-03-26 | Disposition: A | Discharge status: Home / Self Care | Payer: BC Managed Care – PPO | Source: Ambulatory Visit | Attending: Gastroenterology | Admitting: Gastroenterology

## 2012-03-26 DIAGNOSIS — R1013 Epigastric pain: Secondary | ICD-10-CM

## 2012-11-12 ENCOUNTER — Other Ambulatory Visit: Payer: Self-pay

## 2014-03-20 IMAGING — CR DG CHEST 2V
2 series · 2 of 2 positions shown · non-contrast
Comparison: None.

CLINICAL DATA: Chest pain

CHEST - 2 VIEW

[w chest pa]
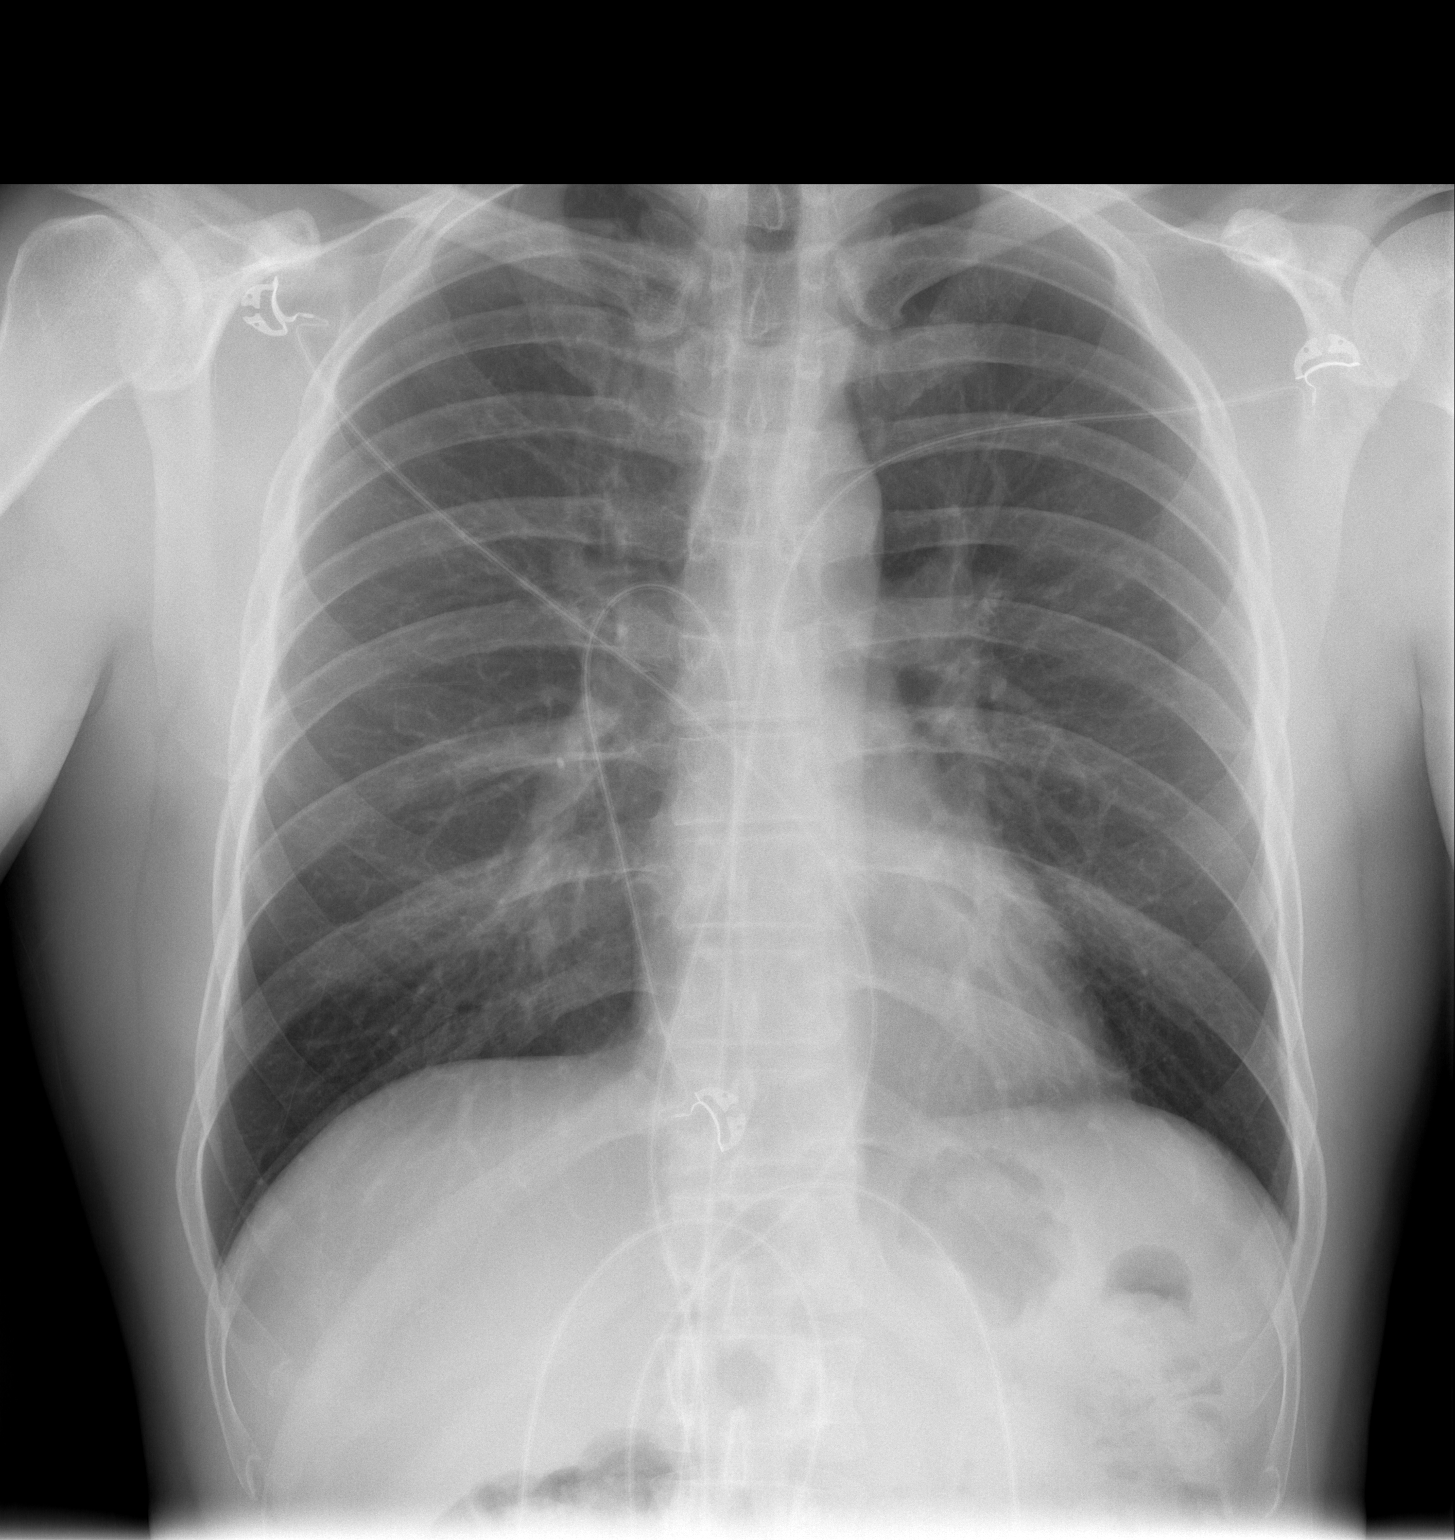

[w chest lat]
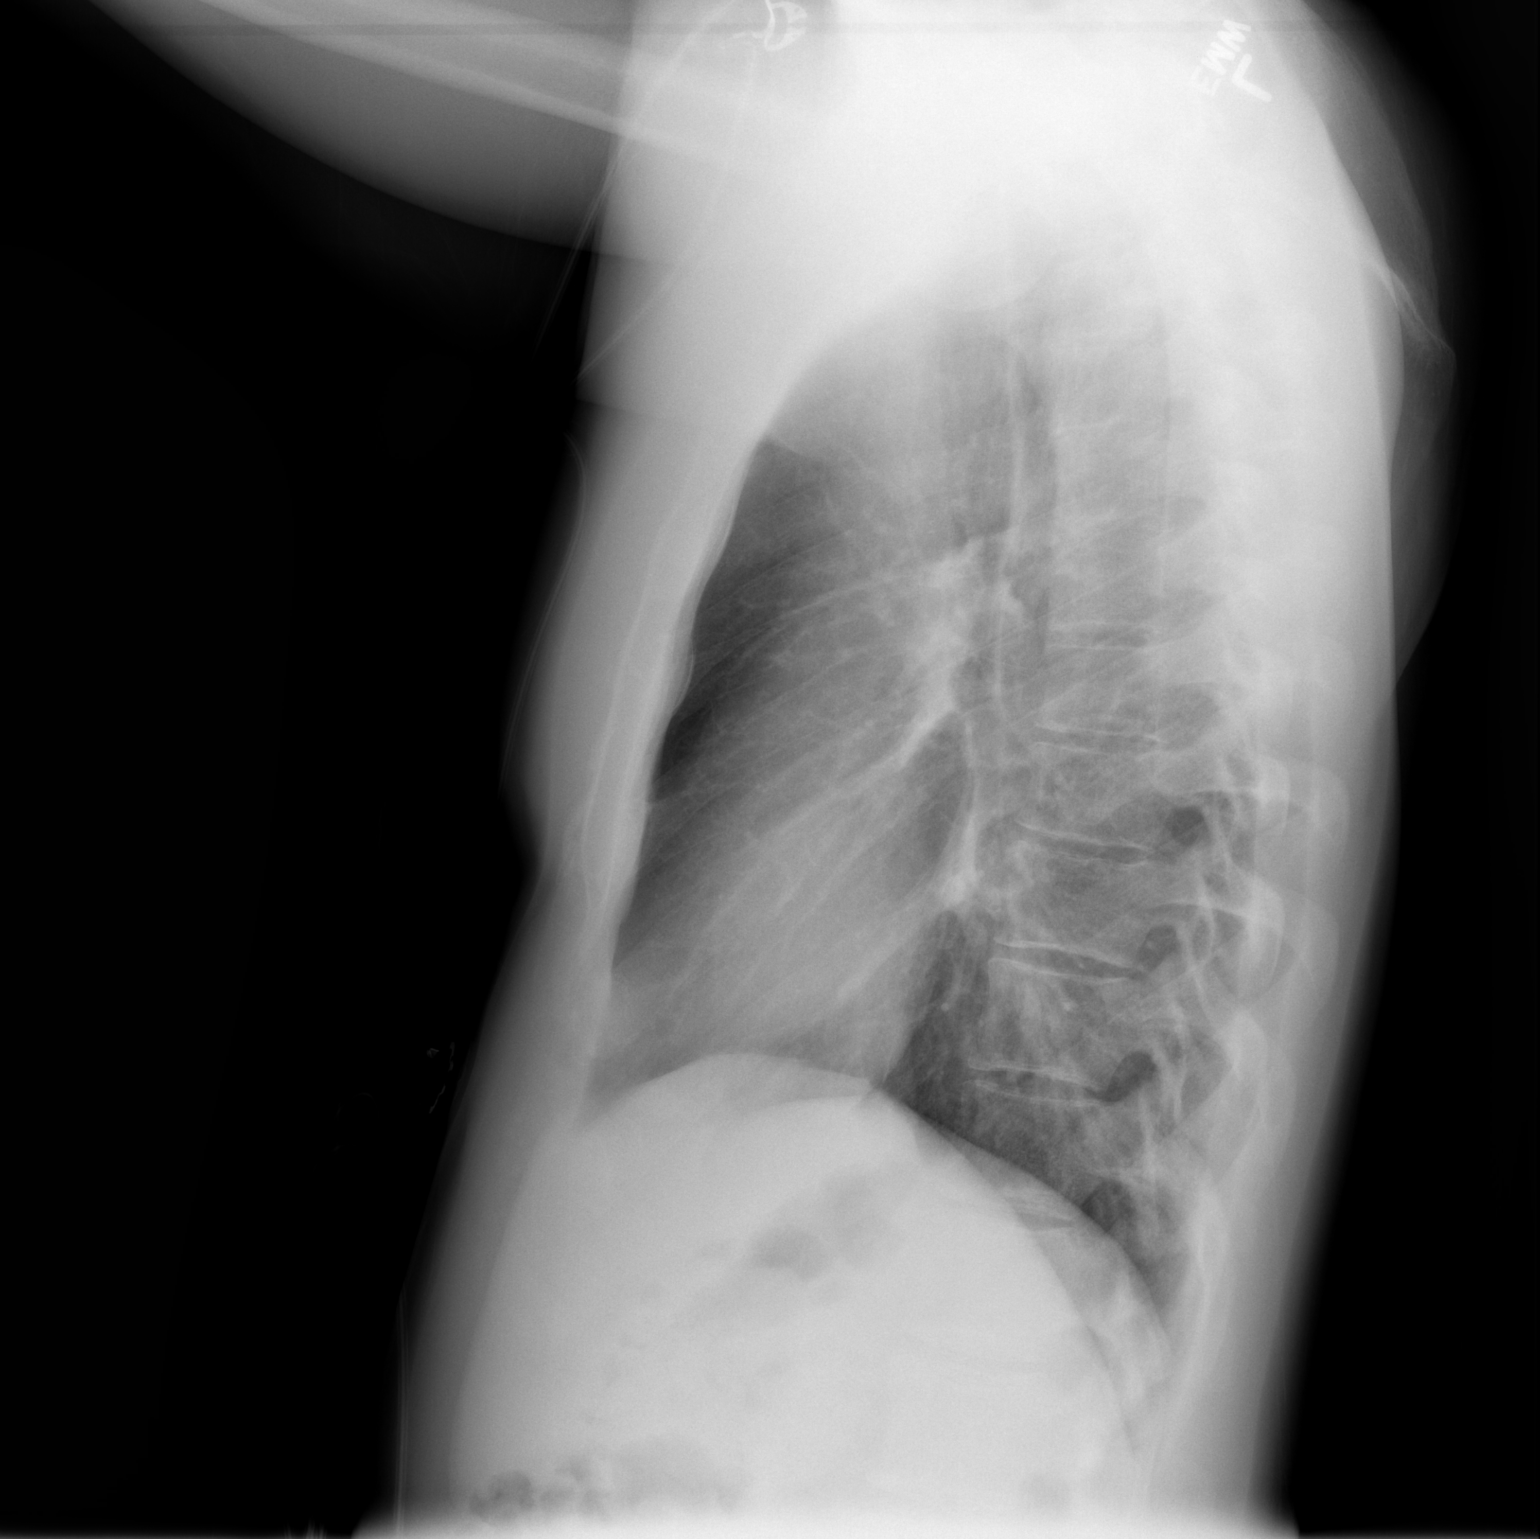

[2 of 2 positions shown; findings below may reference images not displayed]

FINDINGS: Lungs clear.  Heart size and pulmonary vascularity are
normal.  No adenopathy.  No bone lesions.  No pneumothorax.
IMPRESSION: No abnormality noted.

## 2014-03-28 IMAGING — RF DG UGI W/ HIGH DENSITY W/KUB
18 of 24 series · 18 of 24 positions shown · non-contrast
Comparison: Ultrasound of the abdomen of 03/22/2012

CLINICAL DATA: Epigastric pain

UPPER GI SERIES WITH KUB
TECHNIQUE: Routine upper GI series was performed with thin and
high density barium.
Fluoroscopy Time: 2.5 minutes

[Series 1: run · 1 of 1 slices shown (1 of 17)]
[im 1/1]
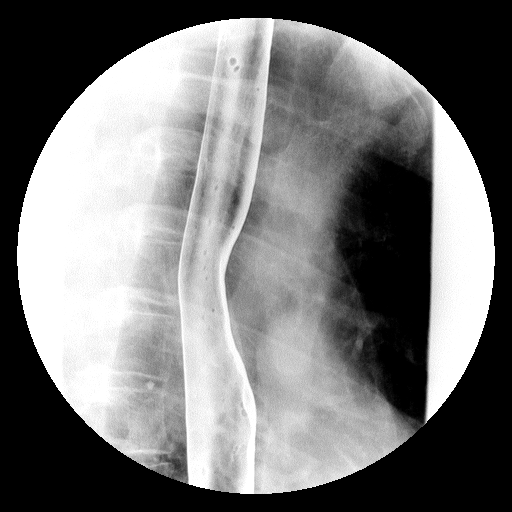

[Series 3: run · 1 of 1 slices shown (2 of 17)]
[im 1/1]
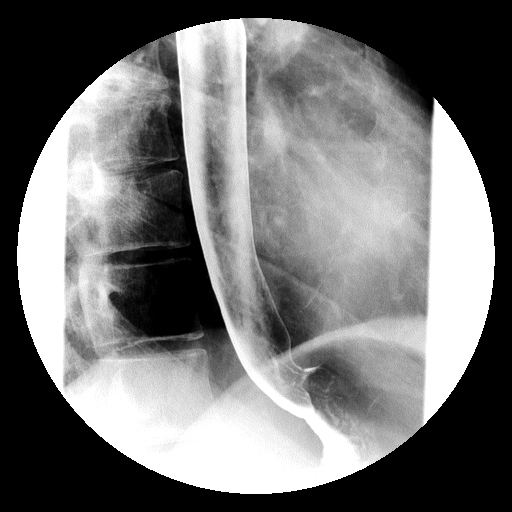

[Series 4: run · 1 of 1 slices shown (3 of 17)]
[im 1/1]
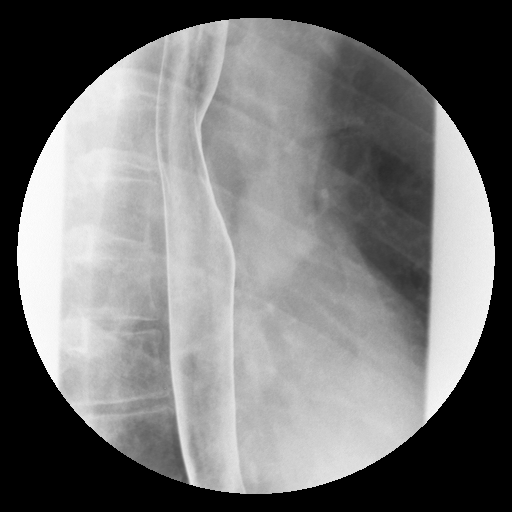

[Series 5: run · 1 of 1 slices shown (4 of 17)]
[im 1/1]
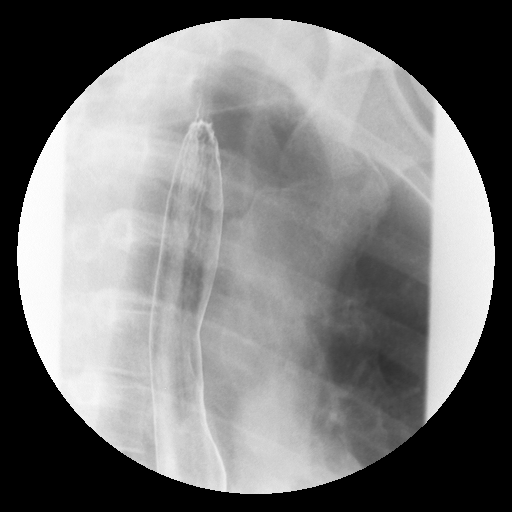

[Series 7: run · 1 of 1 slices shown (5 of 17)]
[im 1/1]
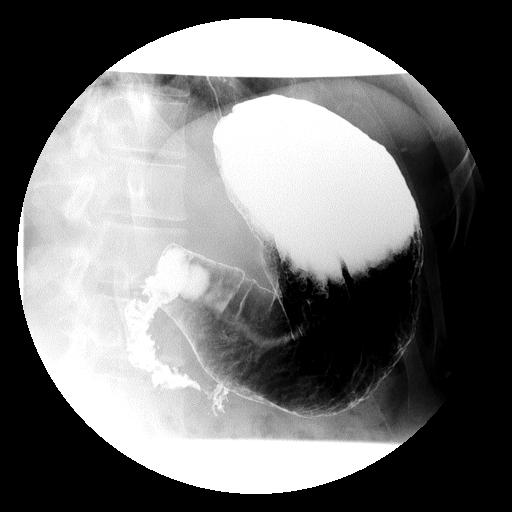

[Series 8: run · 1 of 1 slices shown (6 of 17)]
[im 1/1]
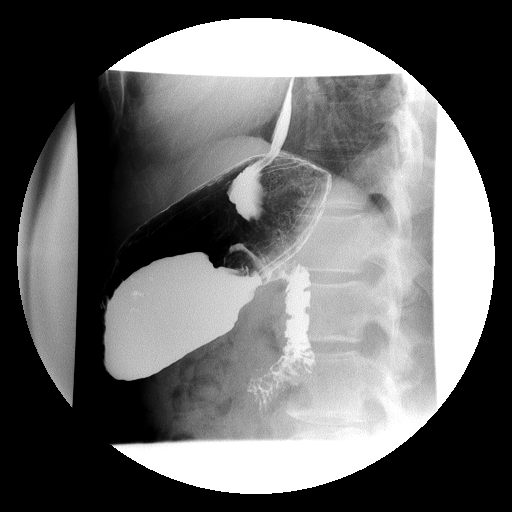

[Series 9: run · 1 of 1 slices shown (7 of 17)]
[im 1/1]
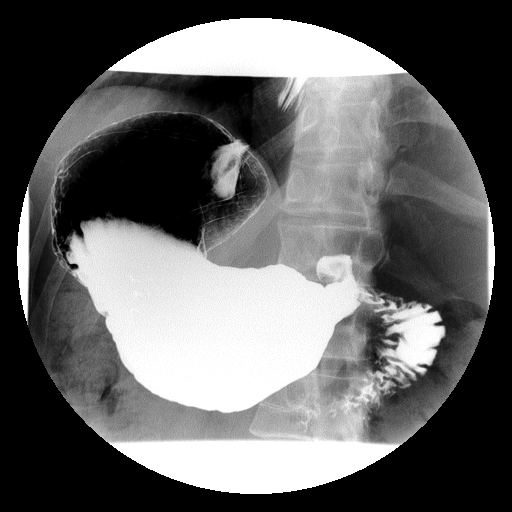

[Series 11: run · 1 of 1 slices shown (8 of 17)]
[im 1/1]
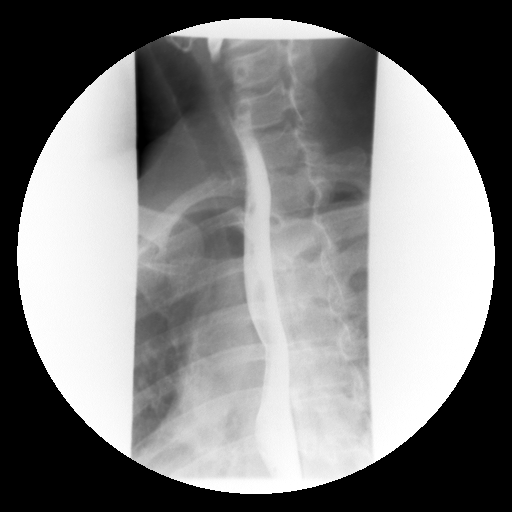

[Series 12: run · 1 of 1 slices shown (9 of 17)]
[im 1/1]
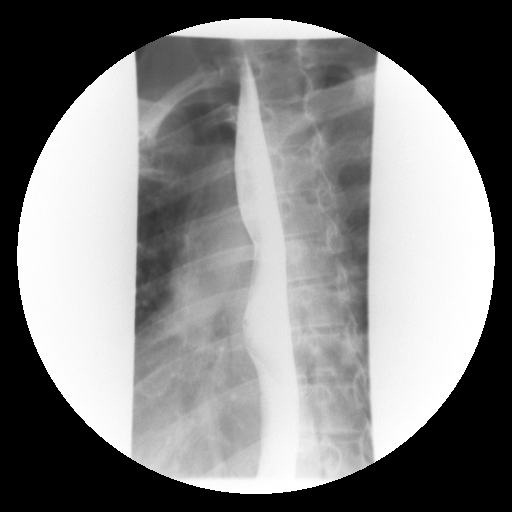

[Series 13: run · 1 of 1 slices shown (10 of 17)]
[im 1/1]
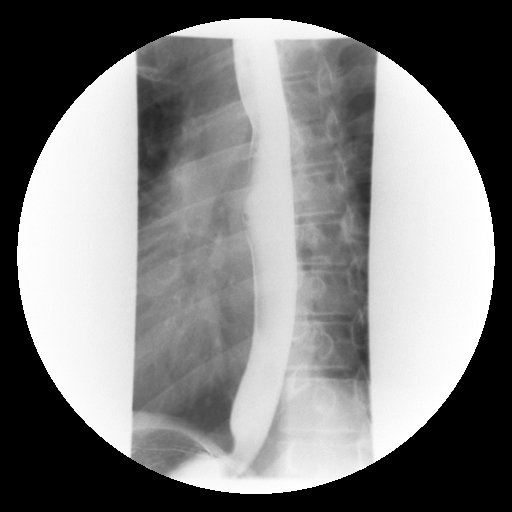

[Series 15: run · 1 of 1 slices shown (11 of 17)]
[im 1/1]
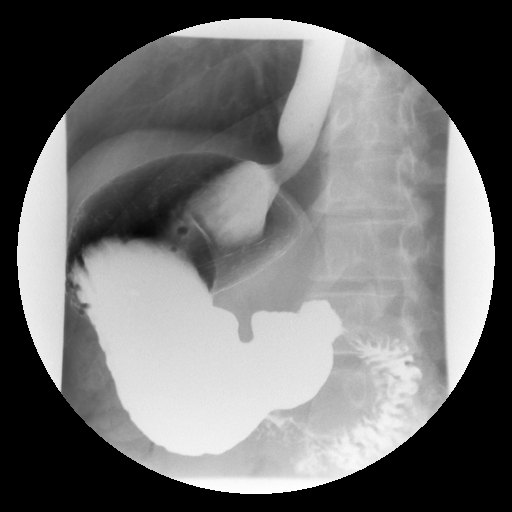

[Series 16: run · 1 of 1 slices shown (12 of 17)]
[im 1/1]
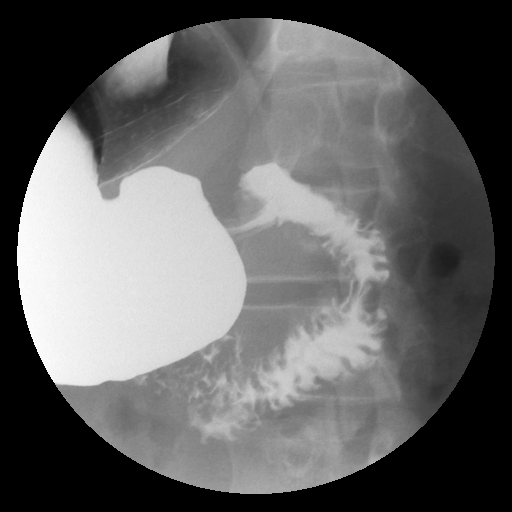

[Series 17: run · 1 of 1 slices shown (13 of 17)]
[im 1/1]
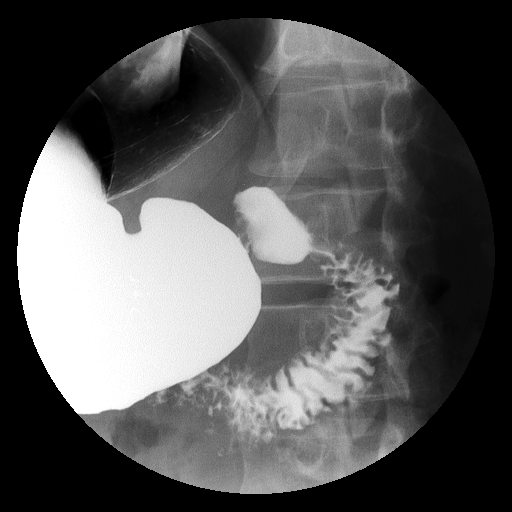

[Series 19: run · 1 of 1 slices shown (14 of 17)]
[im 1/1]
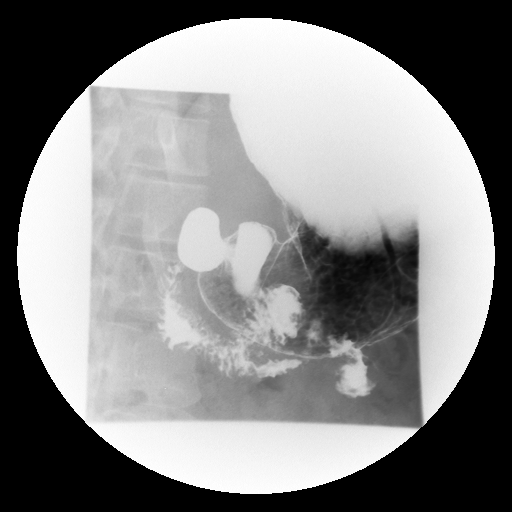

[Series 20: run · 1 of 1 slices shown (15 of 17)]
[im 1/1]
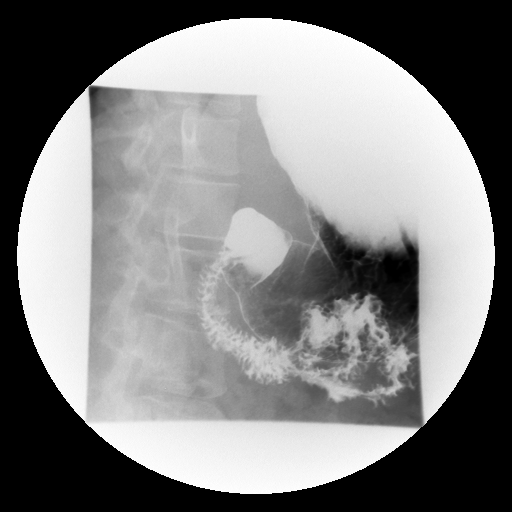

[Series 21: run · 1 of 1 slices shown (16 of 17)]
[im 1/1]
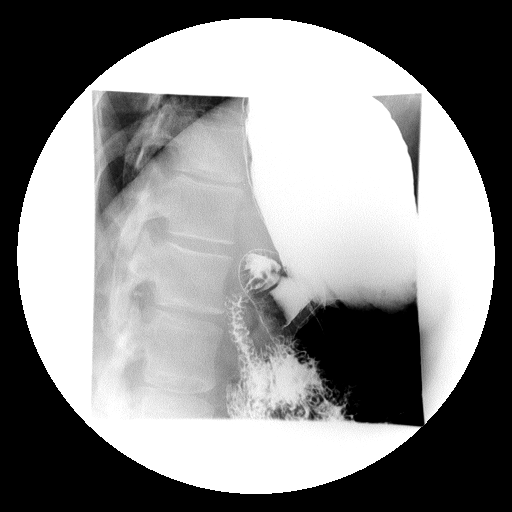

[Series 24: run · 1 of 1 slices shown (17 of 17)]
[im 1/1]
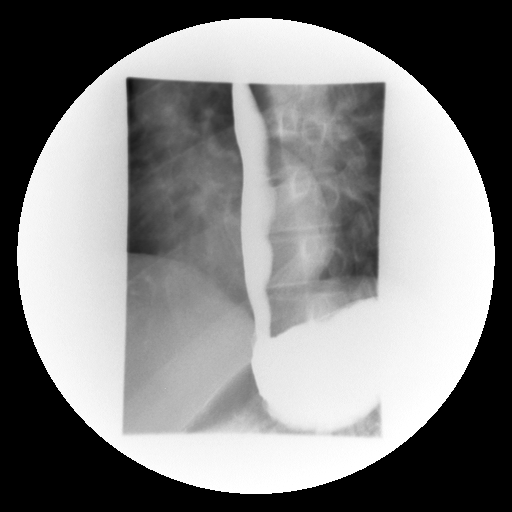

[Series 1001: view not recorded · 0.20mm/px · 1 of 1 slices shown]
[im 1/1]
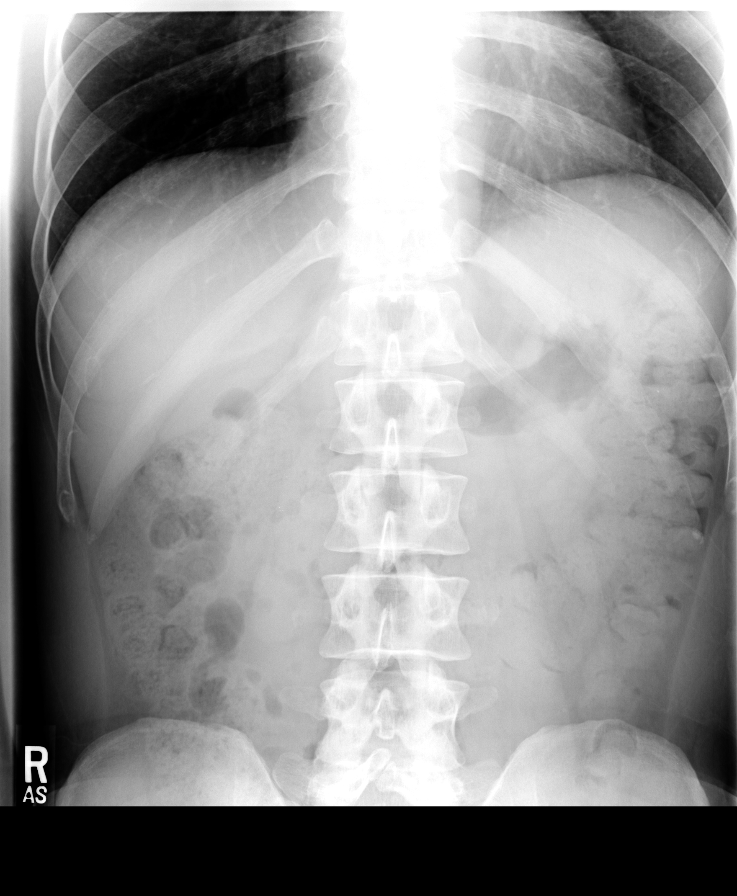

[18 of 24 positions shown; findings below may reference images not displayed]

FINDINGS: A preliminary film of the abdomen shows a moderate amount
of feces throughout the colon.  No bowel obstruction is seen.  No
opaque calculi are noted.  Spina bifida occulta is noted at L5.

A double contrast upper GI was performed.  The mucosa of the
esophagus is unremarkable.  A single contrast study shows the
swallowing mechanism to be normal.  No hiatal hernia is seen.
However, moderate gastroesophageal reflux is demonstrated at the
end of the study.

The stomach is normal in contour and peristalsis.  The duodenal
bulb fills and the duodenal loop is in normal position.
IMPRESSION: 1.  Moderate gastroesophageal reflux.
2.  Spina bifida occulta at L5 of doubtful significance.

## 2015-04-27 DIAGNOSIS — M9903 Segmental and somatic dysfunction of lumbar region: Secondary | ICD-10-CM | POA: Diagnosis not present

## 2015-04-27 DIAGNOSIS — M9902 Segmental and somatic dysfunction of thoracic region: Secondary | ICD-10-CM | POA: Diagnosis not present

## 2015-04-27 DIAGNOSIS — M5408 Panniculitis affecting regions of neck and back, sacral and sacrococcygeal region: Secondary | ICD-10-CM | POA: Diagnosis not present

## 2015-04-27 DIAGNOSIS — M9904 Segmental and somatic dysfunction of sacral region: Secondary | ICD-10-CM | POA: Diagnosis not present

## 2015-05-31 DIAGNOSIS — M9902 Segmental and somatic dysfunction of thoracic region: Secondary | ICD-10-CM | POA: Diagnosis not present

## 2015-05-31 DIAGNOSIS — M9903 Segmental and somatic dysfunction of lumbar region: Secondary | ICD-10-CM | POA: Diagnosis not present

## 2015-05-31 DIAGNOSIS — M9904 Segmental and somatic dysfunction of sacral region: Secondary | ICD-10-CM | POA: Diagnosis not present

## 2015-05-31 DIAGNOSIS — M5408 Panniculitis affecting regions of neck and back, sacral and sacrococcygeal region: Secondary | ICD-10-CM | POA: Diagnosis not present

## 2015-07-13 DIAGNOSIS — M9904 Segmental and somatic dysfunction of sacral region: Secondary | ICD-10-CM | POA: Diagnosis not present

## 2015-07-13 DIAGNOSIS — M5408 Panniculitis affecting regions of neck and back, sacral and sacrococcygeal region: Secondary | ICD-10-CM | POA: Diagnosis not present

## 2015-07-13 DIAGNOSIS — M9903 Segmental and somatic dysfunction of lumbar region: Secondary | ICD-10-CM | POA: Diagnosis not present

## 2015-07-13 DIAGNOSIS — M9902 Segmental and somatic dysfunction of thoracic region: Secondary | ICD-10-CM | POA: Diagnosis not present

## 2015-08-24 DIAGNOSIS — M9903 Segmental and somatic dysfunction of lumbar region: Secondary | ICD-10-CM | POA: Diagnosis not present

## 2015-08-24 DIAGNOSIS — M9902 Segmental and somatic dysfunction of thoracic region: Secondary | ICD-10-CM | POA: Diagnosis not present

## 2015-08-24 DIAGNOSIS — M5408 Panniculitis affecting regions of neck and back, sacral and sacrococcygeal region: Secondary | ICD-10-CM | POA: Diagnosis not present

## 2015-08-24 DIAGNOSIS — M9904 Segmental and somatic dysfunction of sacral region: Secondary | ICD-10-CM | POA: Diagnosis not present

## 2015-10-05 DIAGNOSIS — M9904 Segmental and somatic dysfunction of sacral region: Secondary | ICD-10-CM | POA: Diagnosis not present

## 2015-10-05 DIAGNOSIS — M9902 Segmental and somatic dysfunction of thoracic region: Secondary | ICD-10-CM | POA: Diagnosis not present

## 2015-10-05 DIAGNOSIS — M9903 Segmental and somatic dysfunction of lumbar region: Secondary | ICD-10-CM | POA: Diagnosis not present

## 2015-10-05 DIAGNOSIS — M5408 Panniculitis affecting regions of neck and back, sacral and sacrococcygeal region: Secondary | ICD-10-CM | POA: Diagnosis not present

## 2015-11-16 DIAGNOSIS — M9903 Segmental and somatic dysfunction of lumbar region: Secondary | ICD-10-CM | POA: Diagnosis not present

## 2015-11-16 DIAGNOSIS — M9902 Segmental and somatic dysfunction of thoracic region: Secondary | ICD-10-CM | POA: Diagnosis not present

## 2015-11-16 DIAGNOSIS — M5408 Panniculitis affecting regions of neck and back, sacral and sacrococcygeal region: Secondary | ICD-10-CM | POA: Diagnosis not present

## 2015-11-16 DIAGNOSIS — M9904 Segmental and somatic dysfunction of sacral region: Secondary | ICD-10-CM | POA: Diagnosis not present

## 2015-12-27 DIAGNOSIS — M9903 Segmental and somatic dysfunction of lumbar region: Secondary | ICD-10-CM | POA: Diagnosis not present

## 2015-12-27 DIAGNOSIS — M9904 Segmental and somatic dysfunction of sacral region: Secondary | ICD-10-CM | POA: Diagnosis not present

## 2015-12-27 DIAGNOSIS — M9902 Segmental and somatic dysfunction of thoracic region: Secondary | ICD-10-CM | POA: Diagnosis not present

## 2015-12-27 DIAGNOSIS — M5408 Panniculitis affecting regions of neck and back, sacral and sacrococcygeal region: Secondary | ICD-10-CM | POA: Diagnosis not present

## 2016-02-07 DIAGNOSIS — M5408 Panniculitis affecting regions of neck and back, sacral and sacrococcygeal region: Secondary | ICD-10-CM | POA: Diagnosis not present

## 2016-02-07 DIAGNOSIS — M9902 Segmental and somatic dysfunction of thoracic region: Secondary | ICD-10-CM | POA: Diagnosis not present

## 2016-02-07 DIAGNOSIS — M9903 Segmental and somatic dysfunction of lumbar region: Secondary | ICD-10-CM | POA: Diagnosis not present

## 2016-02-07 DIAGNOSIS — M9904 Segmental and somatic dysfunction of sacral region: Secondary | ICD-10-CM | POA: Diagnosis not present

## 2016-02-20 DIAGNOSIS — R509 Fever, unspecified: Secondary | ICD-10-CM | POA: Diagnosis not present

## 2016-02-20 DIAGNOSIS — J029 Acute pharyngitis, unspecified: Secondary | ICD-10-CM | POA: Diagnosis not present

## 2016-04-29 ENCOUNTER — Emergency Department (HOSPITAL_BASED_OUTPATIENT_CLINIC_OR_DEPARTMENT_OTHER)
Admission: EM | Admit: 2016-04-29 | Discharge: 2016-04-30 | Disposition: A | Discharge status: Home / Self Care | Payer: BLUE CROSS/BLUE SHIELD | Attending: Emergency Medicine | Admitting: Emergency Medicine

## 2016-04-29 ENCOUNTER — Encounter (HOSPITAL_BASED_OUTPATIENT_CLINIC_OR_DEPARTMENT_OTHER): Payer: Self-pay | Admitting: *Deleted

## 2016-04-29 ENCOUNTER — Emergency Department (HOSPITAL_BASED_OUTPATIENT_CLINIC_OR_DEPARTMENT_OTHER): Payer: BLUE CROSS/BLUE SHIELD

## 2016-04-29 DIAGNOSIS — J3489 Other specified disorders of nose and nasal sinuses: Secondary | ICD-10-CM | POA: Diagnosis not present

## 2016-04-29 DIAGNOSIS — R197 Diarrhea, unspecified: Secondary | ICD-10-CM

## 2016-04-29 DIAGNOSIS — R1110 Vomiting, unspecified: Secondary | ICD-10-CM | POA: Diagnosis not present

## 2016-04-29 DIAGNOSIS — R112 Nausea with vomiting, unspecified: Secondary | ICD-10-CM

## 2016-04-29 DIAGNOSIS — N3090 Cystitis, unspecified without hematuria: Secondary | ICD-10-CM | POA: Diagnosis not present

## 2016-04-29 DIAGNOSIS — Z79899 Other long term (current) drug therapy: Secondary | ICD-10-CM | POA: Insufficient documentation

## 2016-04-29 DIAGNOSIS — J45909 Unspecified asthma, uncomplicated: Secondary | ICD-10-CM | POA: Diagnosis not present

## 2016-04-29 DIAGNOSIS — E860 Dehydration: Secondary | ICD-10-CM

## 2016-04-29 DIAGNOSIS — R1084 Generalized abdominal pain: Secondary | ICD-10-CM | POA: Diagnosis not present

## 2016-04-29 LAB — CBC WITH DIFFERENTIAL/PLATELET
Basophils Absolute: 0 10*3/uL (ref 0.0–0.1)
Basophils Relative: 0 %
Eosinophils Absolute: 0 10*3/uL (ref 0.0–0.7)
Eosinophils Relative: 0 %
HCT: 42.6 % (ref 39.0–52.0)
Hemoglobin: 14.9 g/dL (ref 13.0–17.0)
Lymphocytes Relative: 3 %
Lymphs Abs: 0.4 10*3/uL — ABNORMAL LOW (ref 0.7–4.0)
MCH: 30.8 pg (ref 26.0–34.0)
MCHC: 35 g/dL (ref 30.0–36.0)
MCV: 88.2 fL (ref 78.0–100.0)
Monocytes Absolute: 0.4 10*3/uL (ref 0.1–1.0)
Monocytes Relative: 3 %
Neutro Abs: 11.2 10*3/uL — ABNORMAL HIGH (ref 1.7–7.7)
Neutrophils Relative %: 94 %
Platelets: 205 10*3/uL (ref 150–400)
RBC: 4.83 MIL/uL (ref 4.22–5.81)
RDW: 13 % (ref 11.5–15.5)
WBC: 11.9 10*3/uL — ABNORMAL HIGH (ref 4.0–10.5)

## 2016-04-29 LAB — URINALYSIS, ROUTINE W REFLEX MICROSCOPIC
Bilirubin Urine: NEGATIVE
Glucose, UA: NEGATIVE mg/dL
Hgb urine dipstick: NEGATIVE
Ketones, ur: 15 mg/dL — AB
Leukocytes, UA: NEGATIVE
Nitrite: NEGATIVE
Protein, ur: 30 mg/dL — AB
Specific Gravity, Urine: 1.023 (ref 1.005–1.030)
pH: 8 (ref 5.0–8.0)

## 2016-04-29 LAB — URINALYSIS, MICROSCOPIC (REFLEX)

## 2016-04-29 LAB — BASIC METABOLIC PANEL
Anion gap: 14 (ref 5–15)
BUN: 14 mg/dL (ref 6–20)
CO2: 23 mmol/L (ref 22–32)
Calcium: 9.9 mg/dL (ref 8.9–10.3)
Chloride: 105 mmol/L (ref 101–111)
Creatinine, Ser: 1.02 mg/dL (ref 0.61–1.24)
GFR calc Af Amer: >60 mL/min (ref >60)
GFR calc non Af Amer: >60 mL/min (ref >60)
Glucose, Bld: 150 mg/dL — ABNORMAL HIGH (ref 65–99)
Potassium: 3.7 mmol/L (ref 3.5–5.1)
Sodium: 142 mmol/L (ref 135–145)

## 2016-04-29 LAB — I-STAT CG4 LACTIC ACID, ED
Lactic Acid, Venous: 1.43 mmol/L (ref 0.5–1.9)
Lactic Acid, Venous: 3.85 mmol/L (ref 0.5–1.9)

## 2016-04-29 LAB — CK: Total CK: 159 U/L (ref 49–397)

## 2016-04-29 MED ORDER — SODIUM CHLORIDE 0.9 % IV BOLUS (SEPSIS)
1000.0000 mL | Freq: Once | INTRAVENOUS | Status: AC
  Administered 2016-04-29: 1000 mL via INTRAVENOUS

## 2016-04-29 MED ORDER — ONDANSETRON 4 MG PO TBDP
4.0000 mg | ORAL_TABLET | Freq: Once | ORAL | Status: AC
  Administered 2016-04-29: 4 mg via ORAL
  Filled 2016-04-29: qty 1

## 2016-04-29 MED ORDER — IOPAMIDOL (ISOVUE-300) INJECTION 61%
100.0000 mL | Freq: Once | INTRAVENOUS | Status: AC | PRN
  Administered 2016-04-29: 100 mL via INTRAVENOUS

## 2016-04-29 MED ORDER — METOCLOPRAMIDE HCL 5 MG/ML IJ SOLN
10.0000 mg | Freq: Once | INTRAMUSCULAR | Status: AC
  Administered 2016-04-29: 10 mg via INTRAVENOUS
  Filled 2016-04-29: qty 2

## 2016-04-29 NOTE — ED Provider Notes (Signed)
[facility] Provider Note   CSN: [id number] Arrival date & time: 04/29/16  2004   By signing my name below, I, Troy Mercer, attest that this documentation has been prepared under the direction and in the presence of Troy Loll, PA-C. Electronically Signed: Talbert Mercer, Scribe. 04/29/16. 9:20 PM.    History   Chief Complaint Chief Complaint  Patient presents with  . Emesis    HPI Troy Mercer is a 46 y.o. male with no significant past medical history presents to the ED today with complaints of feeling weak after emesis and diarrhea after taking pre-workout powder this morning. Patient states he got this morning feeling fine. Did eat eggs as he usually does. States he took this pre-workout powder for the first time went to workout. During workout he had profuse vomiting and diarrhea with mild abdominal cramping. Patient felt that his blood pressure was dropping and became very pale. Patient states this never happened before. He felt like he may have Salmonella for me in the recalled eggs. He denies any headache, vision changes, fever, lightheadedness, dizziness, chest pain, shortness of breath, nontender, hematochezia, urinary symptoms, paresthesias.     The history is provided by the patient. No language interpreter was used.    Past Medical History:  Diagnosis Date  . Allergy     Patient Active Problem List   Diagnosis Date Noted  . Pharyngitis, acute 02/19/2011  . Glossitis 01/24/2011  . ASTHMA 11/07/2009  . TINNITUS, RIGHT 06/14/2009  . MALE INFERTILITY 09/05/2008  . ALLERGIC RHINITIS 02/16/2007  . CONTUSION, HEAD 02/13/2007    Past Surgical History:  Procedure Laterality Date  . APPENDECTOMY    . HERNIA REPAIR    . SHOULDER SURGERY Right    with bicep tendon repair   . TIBIA FRACTURE SURGERY     left - spiral       Home Medications    Prior to Admission medications   Medication Sig Start Date End Date Taking? Authorizing Provider    calcium carbonate (TUMS - DOSED IN MG ELEMENTAL CALCIUM) 500 MG chewable tablet Chew 2 tablets by mouth 2 (two) times daily as needed for heartburn.     Historical Provider, MD  famotidine (PEPCID) 20 MG tablet Take 1 tablet (20 mg total) by mouth 2 (two) times daily. 03/22/12   Wayland Salinas, MD  naproxen sodium (ANAPROX) 220 MG tablet Take 440 mg by mouth 2 (two) times daily as needed (for pain).    Historical Provider, MD  nitroGLYCERIN (NITROSTAT) 0.4 MG SL tablet Place 1 tablet (0.4 mg total) under the tongue every 5 (five) minutes as needed for chest pain. 03/18/12   Loren Racer, MD  omeprazole (PRILOSEC) 40 MG capsule Take 1 capsule (40 mg total) by mouth daily. 03/23/12   Eulis Foster, FNP  oxyCODONE-acetaminophen (PERCOCET) 5-325 MG per tablet Take 2 tablets by mouth every 4 (four) hours as needed for pain. 03/22/12   Wayland Salinas, MD  ranitidine (ZANTAC) 75 MG tablet Take 75-150 mg by mouth daily as needed for heartburn.     Historical Provider, MD    Family History Family History  Problem Relation Age of Onset  . Hyperlipidemia Other   . Hypertension Other   . Alcohol abuse Other   . Diverticulitis Other   . Arthritis Other     Social History Social History  Substance Use Topics  . Smoking status: Never Smoker  . Smokeless tobacco: Never Used  . Alcohol use Yes  Comment: occassionally     Allergies   Codeine and Penicillins   Review of Systems Review of Systems  Constitutional: Negative for chills and fever.  HENT: Positive for rhinorrhea. Negative for congestion and sore throat.   Eyes: Negative for visual disturbance.  Respiratory: Negative for cough.   Gastrointestinal: Positive for diarrhea and vomiting.  Genitourinary: Negative for dysuria, flank pain, frequency, hematuria and urgency.  Musculoskeletal: Negative for back pain and myalgias.  Skin: Negative for pallor and wound.  Neurological: Positive for weakness. Negative for dizziness, syncope,  light-headedness, numbness and headaches.     Physical Exam Updated Vital Signs BP (!) 141/88   Pulse 96   Temp 97.9 F (36.6 C) (Oral)   Resp 20   Ht  (1.727 m)   Wt 165 lb (74.8 kg)   SpO2 100%   BMI 25.09 kg/m   Physical Exam  Constitutional: He is oriented to person, place, and time. He appears well-developed and well-nourished. No distress.  Nontoxic-appearing.  HENT:  Head: Normocephalic and atraumatic.  Right Ear: Tympanic membrane, external ear and ear canal normal.  Left Ear: Tympanic membrane, external ear and ear canal normal.  Nose: Nose normal.  Mouth/Throat: Oropharynx is clear and moist.  Eyes: Conjunctivae and EOM are normal. Pupils are equal, round, and reactive to light. Right eye exhibits no discharge. Left eye exhibits no discharge. No scleral icterus.  Neck: Normal range of motion. Neck supple. No thyromegaly present.  Cardiovascular: Normal rate, regular rhythm, normal heart sounds and intact distal pulses.  Exam reveals no gallop and no friction rub.   No murmur heard. Pulmonary/Chest: Effort normal and breath sounds normal. No respiratory distress. He has no wheezes. He has no rales. He exhibits no tenderness.  Abdominal: Soft. Bowel sounds are normal. He exhibits no distension. There is generalized tenderness. There is no rigidity, no rebound, no guarding and no CVA tenderness.  Musculoskeletal: Normal range of motion.  Lymphadenopathy:    He has no cervical adenopathy.  Neurological: He is alert and oriented to person, place, and time.  The patient is alert, attentive, and oriented x 3. Speech is clear. Cranial nerve II-VII grossly intact. Negative pronator drift. Sensation intact. Strength 5/5 in all extremities. Reflexes 2+ and symmetric at biceps, triceps, knees, and ankles. Rapid alternating movement and fine finger movements intact. Romberg is absent. Posture and gait normal.   Skin: Skin is warm and dry. Capillary refill takes less than 2  seconds.  Psychiatric: He has a normal mood and affect.  Nursing note and vitals reviewed.    ED Treatments / Results   DIAGNOSTIC STUDIES: Oxygen Saturation is 100% on room air, normal by my interpretation.    COORDINATION OF CARE: 9:19 PM Discussed treatment plan with pt at bedside and pt agreed to plan, which includes medication for nausea, antibiotics.   Labs (all labs ordered are listed, but only abnormal results are displayed) Labs Reviewed  CBC WITH DIFFERENTIAL/PLATELET - Abnormal; Notable for the following:       Result Value   WBC 11.9 (*)    Neutro Abs 11.2 (*)    Lymphs Abs 0.4 (*)    All other components within normal limits  BASIC METABOLIC PANEL - Abnormal; Notable for the following:    Glucose, Bld 150 (*)    All other components within normal limits  URINALYSIS, ROUTINE W REFLEX MICROSCOPIC - Abnormal; Notable for the following:    Ketones, ur 15 (*)    Protein, ur 30 (*)  All other components within normal limits  URINALYSIS, MICROSCOPIC (REFLEX) - Abnormal; Notable for the following:    Bacteria, UA RARE (*)    Squamous Epithelial / LPF 0-5 (*)    All other components within normal limits  I-STAT CG4 LACTIC ACID, ED - Abnormal; Notable for the following:    Lactic Acid, Venous 3.85 (*)    All other components within normal limits  URINE CULTURE  CK  I-STAT CG4 LACTIC ACID, ED    EKG  EKG Interpretation None       Radiology Dg Chest 2 View  Result Date: 04/29/2016 CLINICAL DATA:  Nausea and vomiting for 2 days.  Elevated lactate. EXAM: CHEST  2 VIEW COMPARISON:  Chest radiograph March 18, 2016 FINDINGS: Cardiomediastinal silhouette is normal. No pleural effusions or focal consolidations. Trachea projects midline and there is no pneumothorax. Soft tissue planes and included osseous structures are non-suspicious. IMPRESSION: Normal chest. Electronically Signed   By: Awilda Metro M.D.   On: 04/29/2016 22:25   Ct Abdomen Pelvis W  Contrast  Result Date: 04/30/2016 CLINICAL DATA:  Nausea and vomiting times 18 hours. Leukocytosis with increased lactic acid in urine. History of appendectomy and inguinal hernia repair. EXAM: CT ABDOMEN AND PELVIS WITH CONTRAST TECHNIQUE: Multidetector CT imaging of the abdomen and pelvis was performed using the standard protocol following bolus administration of intravenous contrast. CONTRAST:  ISOVUE-300 IOPAMIDOL (ISOVUE-300) INJECTION 61% COMPARISON:  None. FINDINGS: Lower chest: No acute abnormality. Hepatobiliary: No focal liver abnormality is seen. No gallstones, gallbladder wall thickening, or biliary dilatation. Pancreas: Unremarkable. No pancreatic ductal dilatation or surrounding inflammatory changes. Spleen: Normal in size without focal abnormality. Adrenals/Urinary Tract: Normal bilateral adrenal glands. Slight nonspecific perinephric fat stranding with subtle striated enhancement of the kidneys which can be seen in pyelonephritis or other etiologies such is acute tubular necrosis. No definite ureteral obstruction. Slightly thick-walled bladder may be due to its underdistended state. A cystitis is not excluded. Stomach/Bowel: Stomach is within normal limits. Appendix is surgically absent. Moderate colonic stool burden noted. No evidence of bowel wall thickening, distention, or inflammatory changes. Vascular/Lymphatic: No significant vascular findings are present. No enlarged abdominal or pelvic lymph nodes. Reproductive: Prostate is unremarkable. Other: No abdominal wall hernia or abnormality. No abdominopelvic ascites. Musculoskeletal: No acute nor suspicious osseous lesions. Disc space narrowing at L4-5. IMPRESSION: Nonspecific mild perinephric fat stranding with subtle striated enhancement of the kidneys raise the possibility of pyelonephritis with differential considerations that might include acute tubular necrosis accounting for the subtle appearance though not exclusive. No obstructive  uropathy is seen. Mild mucosal thickening of the bladder may be due to underdistention. A cystitis is may also account this appearance. Electronically Signed   By: Tollie Eth M.D.   On: 04/30/2016 00:35    Procedures Procedures (including critical care time)  Medications Ordered in ED Medications  ondansetron (ZOFRAN-ODT) disintegrating tablet 4 mg (4 mg Oral Given 04/29/16 2015)  sodium chloride 0.9 % bolus 1,000 mL (0 mLs Intravenous Stopped 04/29/16 2301)  sodium chloride 0.9 % bolus 1,000 mL (0 mLs Intravenous Stopped 04/29/16 2301)  iopamidol (ISOVUE-300) 61 % injection 100 mL (100 mLs Intravenous Contrast Given 04/29/16 2340)  metoCLOPramide (REGLAN) injection 10 mg (10 mg Intravenous Given 04/29/16 2321)     Initial Impression / Assessment and Plan / ED Course  I have reviewed the triage vital signs and the nursing notes.  Pertinent labs & imaging results that were available during my care of the patient were reviewed  by me and considered in my medical decision making (see chart for details).     Patient presents to the ED with complaints of nausea, vomiting, diarrhea after taking pre-workout powder this morning. Initial vital signs were normal. No tachycardia or hypotension noted. Patient was afebrile. Initial labs patient with mild leukocytosis of 11,000. Lactic was normal. Creatinine was normal. Electrolytes were normal. Urine with small amount of ketones and proteins along with rare bacteria. Patient with mild abdominal tenderness with no focal abdominal tenderness. CK was normal. Patient initial lactic acid was 3.8. Patient has no infectious symptoms. Did not meet SIRS criteria. Likely due to dehydration given patient's perfuse vomiting and diarrhea since taking his pre-workout powder. Patient was given 2 L of fluid with improvement and normalization of lactic acid. X-ray shows no concerning signs of infection. Patient is a nuchal rigidity concerning for meningitis. CT of abdomen  showed nonspecific mild perinephritic fat stranding with possible pyelonephritis differential also includes acute tubular necrosis also notes mild mucosal thickening of the bladder likely consistent with cystitis. Patient denies any urinary symptoms. Has no CVA tenderness. Doubt pyelonephritis. Urine is not grossly infected. Culture and start patient on antibiotics. Patient has allergy to penicillin. Bactrim. Doubt acute tubular necrosis given normal creatinine seen in urine and normal CK. Patient has no other source of infection. Doubt salmonella infection as patient has no fevers, blood in stool, severe abdominal pain. Vital signs remained stable on discharge. Nausea improved. Patient able tolerate by mouth fluids without any difficulties. Encourage patient to discontinue use of pre-workout powder. Encourage patient to follow up with his primary doctor this week for repeat kidney function in urine. Have given patient strict return precautions. He appears hemodynamically stable and discharged in no acute distress. All questions were answered prior to discharge. Patient verbalized understanding the plan of care and diagnosis. Patient was discussed with Dr. Rush Landmark was agreeable to above plan.   Final Clinical Impressions(s) / ED Diagnoses   Final diagnoses:  Dehydration  Nausea vomiting and diarrhea  Cystitis    New Prescriptions Discharge Medication List as of 04/30/2016  1:16 AM    START taking these medications   Details  sulfamethoxazole-trimethoprim (BACTRIM DS,SEPTRA DS) 800-160 MG tablet Take 1 tablet by mouth 2 (two) times daily., Starting Tue 04/30/2016, Print       I personally performed the services described in this documentation, which was scribed in my presence. The recorded information has been reviewed and is accurate.     Rise Mu, PA-C 04/30/16 1610    Canary Brim Tegeler, MD 05/01/16 1009

## 2016-04-29 NOTE — ED Triage Notes (Signed)
States he has been weak after vomiting earlier today. He took a pre work out powder that caused his BP to drop.

## 2016-04-29 NOTE — ED Notes (Signed)
Family at bedside. 

## 2016-04-30 MED ORDER — SULFAMETHOXAZOLE-TRIMETHOPRIM 800-160 MG PO TABS: 1.0000 | ORAL_TABLET | Freq: Two times a day (BID) | ORAL | 0 refills | Status: DC

## 2016-04-30 NOTE — Discharge Instructions (Signed)
Please take antibiotics as prescribed. All of your workup has been reassuring. Possible kidney infection.stay away from the pre-workout supplements. Make sure you drink plenty of fluids. Follow-up with her primary care doctor to have your urine and kidney function rechecked. Return to ED if your symptoms worsen.

## 2016-05-01 DIAGNOSIS — M5408 Panniculitis affecting regions of neck and back, sacral and sacrococcygeal region: Secondary | ICD-10-CM | POA: Diagnosis not present

## 2016-05-01 DIAGNOSIS — M9902 Segmental and somatic dysfunction of thoracic region: Secondary | ICD-10-CM | POA: Diagnosis not present

## 2016-05-01 DIAGNOSIS — M9904 Segmental and somatic dysfunction of sacral region: Secondary | ICD-10-CM | POA: Diagnosis not present

## 2016-05-01 DIAGNOSIS — M9903 Segmental and somatic dysfunction of lumbar region: Secondary | ICD-10-CM | POA: Diagnosis not present

## 2016-05-01 LAB — URINE CULTURE: Culture: NO GROWTH

## 2016-05-16 DIAGNOSIS — R350 Frequency of micturition: Secondary | ICD-10-CM | POA: Diagnosis not present

## 2016-06-11 DIAGNOSIS — M5408 Panniculitis affecting regions of neck and back, sacral and sacrococcygeal region: Secondary | ICD-10-CM | POA: Diagnosis not present

## 2016-06-11 DIAGNOSIS — M9903 Segmental and somatic dysfunction of lumbar region: Secondary | ICD-10-CM | POA: Diagnosis not present

## 2016-06-11 DIAGNOSIS — M9904 Segmental and somatic dysfunction of sacral region: Secondary | ICD-10-CM | POA: Diagnosis not present

## 2016-06-11 DIAGNOSIS — M9902 Segmental and somatic dysfunction of thoracic region: Secondary | ICD-10-CM | POA: Diagnosis not present

## 2016-07-24 DIAGNOSIS — M9904 Segmental and somatic dysfunction of sacral region: Secondary | ICD-10-CM | POA: Diagnosis not present

## 2016-07-24 DIAGNOSIS — M9903 Segmental and somatic dysfunction of lumbar region: Secondary | ICD-10-CM | POA: Diagnosis not present

## 2016-07-24 DIAGNOSIS — M9902 Segmental and somatic dysfunction of thoracic region: Secondary | ICD-10-CM | POA: Diagnosis not present

## 2016-07-24 DIAGNOSIS — M5408 Panniculitis affecting regions of neck and back, sacral and sacrococcygeal region: Secondary | ICD-10-CM | POA: Diagnosis not present

## 2016-09-04 DIAGNOSIS — M5408 Panniculitis affecting regions of neck and back, sacral and sacrococcygeal region: Secondary | ICD-10-CM | POA: Diagnosis not present

## 2016-09-04 DIAGNOSIS — M9904 Segmental and somatic dysfunction of sacral region: Secondary | ICD-10-CM | POA: Diagnosis not present

## 2016-09-04 DIAGNOSIS — M9903 Segmental and somatic dysfunction of lumbar region: Secondary | ICD-10-CM | POA: Diagnosis not present

## 2016-09-04 DIAGNOSIS — M9902 Segmental and somatic dysfunction of thoracic region: Secondary | ICD-10-CM | POA: Diagnosis not present

## 2016-09-11 DIAGNOSIS — M5408 Panniculitis affecting regions of neck and back, sacral and sacrococcygeal region: Secondary | ICD-10-CM | POA: Diagnosis not present

## 2016-09-11 DIAGNOSIS — M9902 Segmental and somatic dysfunction of thoracic region: Secondary | ICD-10-CM | POA: Diagnosis not present

## 2016-09-11 DIAGNOSIS — M9904 Segmental and somatic dysfunction of sacral region: Secondary | ICD-10-CM | POA: Diagnosis not present

## 2016-09-11 DIAGNOSIS — M9903 Segmental and somatic dysfunction of lumbar region: Secondary | ICD-10-CM | POA: Diagnosis not present

## 2016-10-16 DIAGNOSIS — M9904 Segmental and somatic dysfunction of sacral region: Secondary | ICD-10-CM | POA: Diagnosis not present

## 2016-10-16 DIAGNOSIS — M9902 Segmental and somatic dysfunction of thoracic region: Secondary | ICD-10-CM | POA: Diagnosis not present

## 2016-10-16 DIAGNOSIS — M9903 Segmental and somatic dysfunction of lumbar region: Secondary | ICD-10-CM | POA: Diagnosis not present

## 2016-10-16 DIAGNOSIS — M5408 Panniculitis affecting regions of neck and back, sacral and sacrococcygeal region: Secondary | ICD-10-CM | POA: Diagnosis not present

## 2016-11-20 DIAGNOSIS — M9904 Segmental and somatic dysfunction of sacral region: Secondary | ICD-10-CM | POA: Diagnosis not present

## 2016-11-20 DIAGNOSIS — M5408 Panniculitis affecting regions of neck and back, sacral and sacrococcygeal region: Secondary | ICD-10-CM | POA: Diagnosis not present

## 2016-11-20 DIAGNOSIS — M9903 Segmental and somatic dysfunction of lumbar region: Secondary | ICD-10-CM | POA: Diagnosis not present

## 2016-11-20 DIAGNOSIS — M9902 Segmental and somatic dysfunction of thoracic region: Secondary | ICD-10-CM | POA: Diagnosis not present

## 2016-12-25 DIAGNOSIS — M9904 Segmental and somatic dysfunction of sacral region: Secondary | ICD-10-CM | POA: Diagnosis not present

## 2016-12-25 DIAGNOSIS — M5408 Panniculitis affecting regions of neck and back, sacral and sacrococcygeal region: Secondary | ICD-10-CM | POA: Diagnosis not present

## 2016-12-25 DIAGNOSIS — M9902 Segmental and somatic dysfunction of thoracic region: Secondary | ICD-10-CM | POA: Diagnosis not present

## 2016-12-25 DIAGNOSIS — M9903 Segmental and somatic dysfunction of lumbar region: Secondary | ICD-10-CM | POA: Diagnosis not present

## 2017-02-05 DIAGNOSIS — M9902 Segmental and somatic dysfunction of thoracic region: Secondary | ICD-10-CM | POA: Diagnosis not present

## 2017-02-05 DIAGNOSIS — M9904 Segmental and somatic dysfunction of sacral region: Secondary | ICD-10-CM | POA: Diagnosis not present

## 2017-02-05 DIAGNOSIS — M5408 Panniculitis affecting regions of neck and back, sacral and sacrococcygeal region: Secondary | ICD-10-CM | POA: Diagnosis not present

## 2017-02-05 DIAGNOSIS — M9903 Segmental and somatic dysfunction of lumbar region: Secondary | ICD-10-CM | POA: Diagnosis not present

## 2017-02-07 DIAGNOSIS — M5408 Panniculitis affecting regions of neck and back, sacral and sacrococcygeal region: Secondary | ICD-10-CM | POA: Diagnosis not present

## 2017-02-07 DIAGNOSIS — M9904 Segmental and somatic dysfunction of sacral region: Secondary | ICD-10-CM | POA: Diagnosis not present

## 2017-02-07 DIAGNOSIS — M9903 Segmental and somatic dysfunction of lumbar region: Secondary | ICD-10-CM | POA: Diagnosis not present

## 2017-02-07 DIAGNOSIS — M9902 Segmental and somatic dysfunction of thoracic region: Secondary | ICD-10-CM | POA: Diagnosis not present

## 2017-03-19 DIAGNOSIS — M9903 Segmental and somatic dysfunction of lumbar region: Secondary | ICD-10-CM | POA: Diagnosis not present

## 2017-03-19 DIAGNOSIS — M5408 Panniculitis affecting regions of neck and back, sacral and sacrococcygeal region: Secondary | ICD-10-CM | POA: Diagnosis not present

## 2017-03-19 DIAGNOSIS — M9902 Segmental and somatic dysfunction of thoracic region: Secondary | ICD-10-CM | POA: Diagnosis not present

## 2017-03-19 DIAGNOSIS — M9904 Segmental and somatic dysfunction of sacral region: Secondary | ICD-10-CM | POA: Diagnosis not present

## 2017-04-23 DIAGNOSIS — M5408 Panniculitis affecting regions of neck and back, sacral and sacrococcygeal region: Secondary | ICD-10-CM | POA: Diagnosis not present

## 2017-04-23 DIAGNOSIS — M9903 Segmental and somatic dysfunction of lumbar region: Secondary | ICD-10-CM | POA: Diagnosis not present

## 2017-04-23 DIAGNOSIS — M9902 Segmental and somatic dysfunction of thoracic region: Secondary | ICD-10-CM | POA: Diagnosis not present

## 2017-04-23 DIAGNOSIS — M9904 Segmental and somatic dysfunction of sacral region: Secondary | ICD-10-CM | POA: Diagnosis not present

## 2017-06-04 DIAGNOSIS — M9904 Segmental and somatic dysfunction of sacral region: Secondary | ICD-10-CM | POA: Diagnosis not present

## 2017-06-04 DIAGNOSIS — M5408 Panniculitis affecting regions of neck and back, sacral and sacrococcygeal region: Secondary | ICD-10-CM | POA: Diagnosis not present

## 2017-06-04 DIAGNOSIS — M9902 Segmental and somatic dysfunction of thoracic region: Secondary | ICD-10-CM | POA: Diagnosis not present

## 2017-06-04 DIAGNOSIS — M9903 Segmental and somatic dysfunction of lumbar region: Secondary | ICD-10-CM | POA: Diagnosis not present

## 2017-07-16 DIAGNOSIS — M9903 Segmental and somatic dysfunction of lumbar region: Secondary | ICD-10-CM | POA: Diagnosis not present

## 2017-07-16 DIAGNOSIS — M9902 Segmental and somatic dysfunction of thoracic region: Secondary | ICD-10-CM | POA: Diagnosis not present

## 2017-07-16 DIAGNOSIS — M5408 Panniculitis affecting regions of neck and back, sacral and sacrococcygeal region: Secondary | ICD-10-CM | POA: Diagnosis not present

## 2017-07-16 DIAGNOSIS — M9904 Segmental and somatic dysfunction of sacral region: Secondary | ICD-10-CM | POA: Diagnosis not present

## 2017-08-27 DIAGNOSIS — M9904 Segmental and somatic dysfunction of sacral region: Secondary | ICD-10-CM | POA: Diagnosis not present

## 2017-08-27 DIAGNOSIS — M9903 Segmental and somatic dysfunction of lumbar region: Secondary | ICD-10-CM | POA: Diagnosis not present

## 2017-08-27 DIAGNOSIS — M5408 Panniculitis affecting regions of neck and back, sacral and sacrococcygeal region: Secondary | ICD-10-CM | POA: Diagnosis not present

## 2017-08-27 DIAGNOSIS — M9902 Segmental and somatic dysfunction of thoracic region: Secondary | ICD-10-CM | POA: Diagnosis not present

## 2017-10-08 DIAGNOSIS — M9903 Segmental and somatic dysfunction of lumbar region: Secondary | ICD-10-CM | POA: Diagnosis not present

## 2017-10-08 DIAGNOSIS — M5408 Panniculitis affecting regions of neck and back, sacral and sacrococcygeal region: Secondary | ICD-10-CM | POA: Diagnosis not present

## 2017-10-08 DIAGNOSIS — M9902 Segmental and somatic dysfunction of thoracic region: Secondary | ICD-10-CM | POA: Diagnosis not present

## 2017-10-08 DIAGNOSIS — M9904 Segmental and somatic dysfunction of sacral region: Secondary | ICD-10-CM | POA: Diagnosis not present

## 2017-11-19 DIAGNOSIS — M5408 Panniculitis affecting regions of neck and back, sacral and sacrococcygeal region: Secondary | ICD-10-CM | POA: Diagnosis not present

## 2017-11-19 DIAGNOSIS — M9903 Segmental and somatic dysfunction of lumbar region: Secondary | ICD-10-CM | POA: Diagnosis not present

## 2017-11-19 DIAGNOSIS — M9902 Segmental and somatic dysfunction of thoracic region: Secondary | ICD-10-CM | POA: Diagnosis not present

## 2017-11-19 DIAGNOSIS — M9904 Segmental and somatic dysfunction of sacral region: Secondary | ICD-10-CM | POA: Diagnosis not present

## 2018-01-14 DIAGNOSIS — M5408 Panniculitis affecting regions of neck and back, sacral and sacrococcygeal region: Secondary | ICD-10-CM | POA: Diagnosis not present

## 2018-01-14 DIAGNOSIS — M9903 Segmental and somatic dysfunction of lumbar region: Secondary | ICD-10-CM | POA: Diagnosis not present

## 2018-01-14 DIAGNOSIS — M9902 Segmental and somatic dysfunction of thoracic region: Secondary | ICD-10-CM | POA: Diagnosis not present

## 2018-01-14 DIAGNOSIS — M9904 Segmental and somatic dysfunction of sacral region: Secondary | ICD-10-CM | POA: Diagnosis not present

## 2018-02-05 DIAGNOSIS — G4700 Insomnia, unspecified: Secondary | ICD-10-CM | POA: Diagnosis not present

## 2018-02-11 DIAGNOSIS — Z0000 Encounter for general adult medical examination without abnormal findings: Secondary | ICD-10-CM | POA: Diagnosis not present

## 2018-02-11 DIAGNOSIS — Z136 Encounter for screening for cardiovascular disorders: Secondary | ICD-10-CM | POA: Diagnosis not present

## 2018-02-11 DIAGNOSIS — Z131 Encounter for screening for diabetes mellitus: Secondary | ICD-10-CM | POA: Diagnosis not present

## 2018-02-25 DIAGNOSIS — M5408 Panniculitis affecting regions of neck and back, sacral and sacrococcygeal region: Secondary | ICD-10-CM | POA: Diagnosis not present

## 2018-02-25 DIAGNOSIS — M9904 Segmental and somatic dysfunction of sacral region: Secondary | ICD-10-CM | POA: Diagnosis not present

## 2018-02-25 DIAGNOSIS — M9903 Segmental and somatic dysfunction of lumbar region: Secondary | ICD-10-CM | POA: Diagnosis not present

## 2018-02-25 DIAGNOSIS — M9902 Segmental and somatic dysfunction of thoracic region: Secondary | ICD-10-CM | POA: Diagnosis not present

## 2018-03-02 ENCOUNTER — Other Ambulatory Visit: Payer: Self-pay

## 2018-03-02 ENCOUNTER — Ambulatory Visit (INDEPENDENT_AMBULATORY_CARE_PROVIDER_SITE_OTHER): Payer: BLUE CROSS/BLUE SHIELD | Admitting: Psychiatry

## 2018-03-02 DIAGNOSIS — F411 Generalized anxiety disorder: Secondary | ICD-10-CM | POA: Diagnosis not present

## 2018-03-02 DIAGNOSIS — F4323 Adjustment disorder with mixed anxiety and depressed mood: Secondary | ICD-10-CM | POA: Diagnosis not present

## 2018-03-02 MED ORDER — SERTRALINE HCL 25 MG PO TABS: ORAL_TABLET | ORAL | 1 refills | Status: DC

## 2018-03-02 MED ORDER — ALPRAZOLAM 0.25 MG PO TABS: ORAL_TABLET | ORAL | 0 refills | Status: DC

## 2018-03-02 MED ORDER — SERTRALINE HCL 50 MG PO TABS: ORAL_TABLET | ORAL | 1 refills | Status: DC
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
## 2018-03-05 ENCOUNTER — Telehealth: Payer: Self-pay | Admitting: Psychiatry

## 2018-03-05 NOTE — Telephone Encounter (Signed)
Patient called and said that he just started the zoloft and he is having some problems. Please call him at [phone]

## 2018-03-19 ENCOUNTER — Ambulatory Visit: Payer: BLUE CROSS/BLUE SHIELD | Admitting: Mental Health

## 2018-03-30 ENCOUNTER — Ambulatory Visit: Payer: BLUE CROSS/BLUE SHIELD | Admitting: Psychiatry

## 2018-05-01 IMAGING — CR DG CHEST 2V
2 series · 2 of 2 positions shown · non-contrast
Comparison: Chest radiograph March 18, 2016

CLINICAL DATA: Nausea and vomiting for 2 days.  Elevated lactate.

EXAM:
CHEST  2 VIEW

[w chest pa]
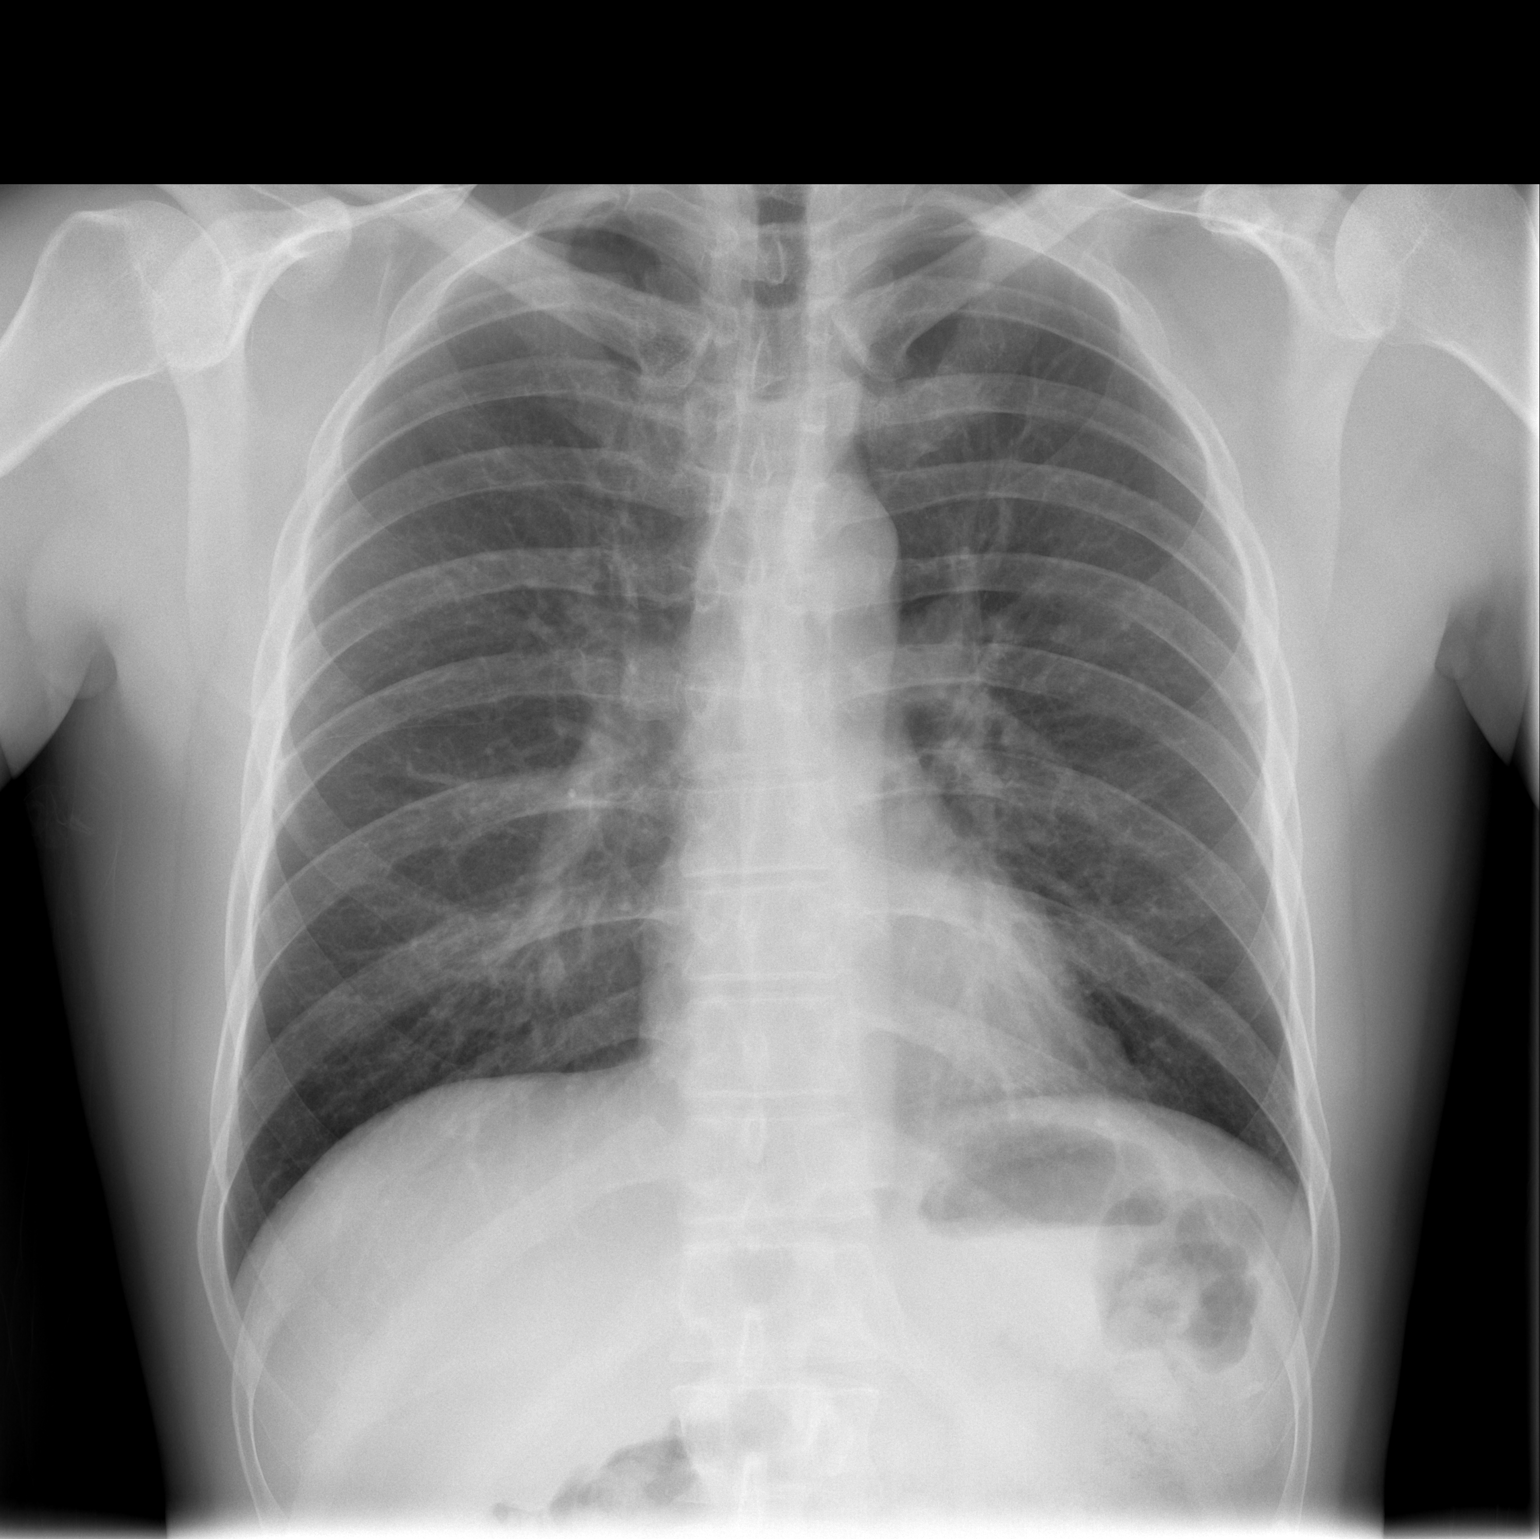

[w chest lat]
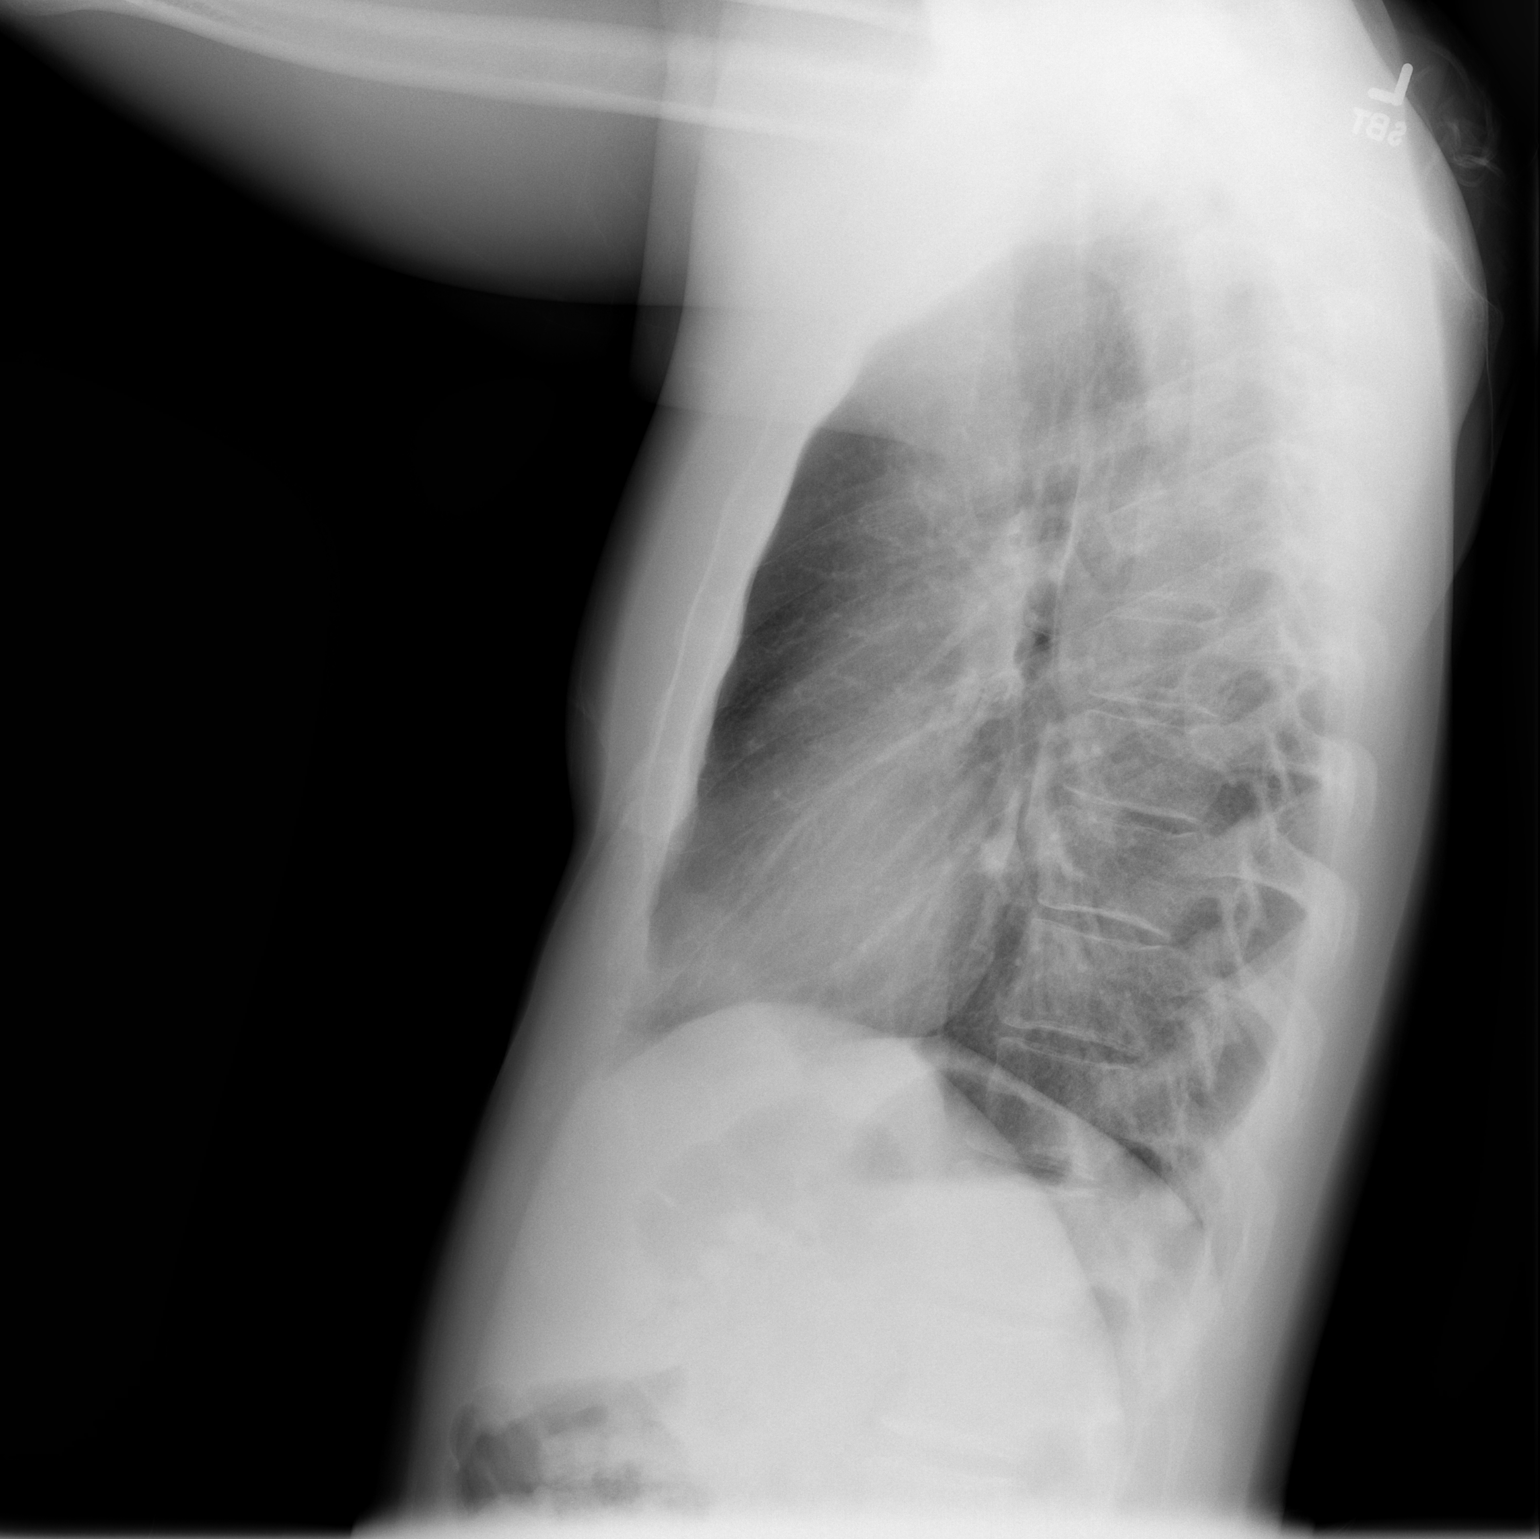

[2 of 2 positions shown; findings below may reference images not displayed]

FINDINGS: Cardiomediastinal silhouette is normal. No pleural effusions or
focal consolidations. Trachea projects midline and there is no
pneumothorax. Soft tissue planes and included osseous structures are
non-suspicious.
IMPRESSION: Normal chest.

## 2018-05-05 ENCOUNTER — Ambulatory Visit: Payer: BLUE CROSS/BLUE SHIELD | Admitting: Psychiatry

## 2018-05-12 ENCOUNTER — Other Ambulatory Visit: Payer: Self-pay

## 2018-05-12 ENCOUNTER — Telehealth: Payer: Self-pay | Admitting: Physician Assistant

## 2018-05-12 MED ORDER — ALPRAZOLAM 0.25 MG PO TABS: ORAL_TABLET | ORAL | 0 refills | Status: DC

## 2018-05-12 NOTE — Telephone Encounter (Signed)
Troy Mercer called to request refill of his alprazolam.  He saw [name] one time.  He is still taking it as prescribed.  Has appt with [name] 05/22/18.  Send to [facility] [address]

## 2018-05-12 NOTE — Telephone Encounter (Signed)
Called into pharmacy

## 2018-05-22 ENCOUNTER — Encounter: Payer: Self-pay | Admitting: Physician Assistant

## 2018-05-22 ENCOUNTER — Other Ambulatory Visit: Payer: Self-pay

## 2018-05-22 ENCOUNTER — Ambulatory Visit (INDEPENDENT_AMBULATORY_CARE_PROVIDER_SITE_OTHER): Payer: BLUE CROSS/BLUE SHIELD | Admitting: Physician Assistant

## 2018-05-22 DIAGNOSIS — G4700 Insomnia, unspecified: Secondary | ICD-10-CM

## 2018-05-22 DIAGNOSIS — F4322 Adjustment disorder with anxiety: Secondary | ICD-10-CM | POA: Diagnosis not present

## 2018-05-22 MED ORDER — TRAZODONE HCL 50 MG PO TABS: 25.0000 mg | ORAL_TABLET | Freq: Every evening | ORAL | 1 refills | Status: DC | PRN

## 2018-05-22 NOTE — Progress Notes (Signed)
[facility]  Patient ID: Troy Mercer,  MRN: [id number]  PCP: Joycelyn RuaMeyers, Stephen, MD  Date of Evaluation: 05/22/2018 Time spent:15 minutes  Chief Complaint:  Chief Complaint    Anxiety; Follow-up     Virtual Visit via Telephone Note  I connected with patient by a video enabled telemedicine application or telephone, with their informed consent, and verified patient privacy and that I am speaking with the correct person using two identifiers.  I am private, in my home and the patient is home.   I discussed the limitations, risks, security and privacy concerns of performing an evaluation and management service by telephone and the availability of in person appointments. I also discussed with the patient that there may be a patient responsible charge related to this service. The patient expressed understanding and agreed to proceed.   I discussed the assessment and treatment plan with the patient. The patient was provided an opportunity to ask questions and all were answered. The patient agreed with the plan and demonstrated an understanding of the instructions.   The patient was advised to call back or seek an in-person evaluation if the symptoms worsen or if the condition fails to improve as anticipated.  I provided 15 minutes of non-face-to-face time during this encounter.  HISTORY/CURRENT STATUS: HPI For f/u of anxiety.  Couldn't tolerate the Zoloft, made him feel bad, had terrible insomnia, whether he took it in the morning at night.  Had several days without sleep and he couldn't take that.     He still taking the Xanax but has only had 2 times in the past few weeks where he has taken 0.25 mg, one half of a pill for anxiety which did help.  He has started walking 30 to 45 minutes twice a day which is also been helpful.  He does have trouble sleeping, sometimes he has trouble falling asleep but the biggest problem is not being able to stay asleep.  He usually gets up 1  time a night to go to the bathroom and then after that he will lay awake and start thinking about things he needs to do the next day or what ever and he cannot relax to go back to sleep.  Melatonin does not really help anymore.  Denies anhedonia, decreased energy or motivation, or isolating any more than is necessary due to the coronavirus pandemic.  He does not cry easily.  Denies dizziness, syncope, seizures, numbness, tingling, tremor, tics, unsteady gait, slurred speech, confusion. Denies muscle or joint pain, stiffness, or dystonia.  Individual Medical History/ Review of Systems: Changes? :No    Past medications for mental health diagnoses include: Zoloft caused insomnia  Allergies: Codeine and Penicillins  Current Medications:  Current Outpatient Medications:  .  ALPRAZolam (XANAX) 0.25 MG tablet, 1-2 tabs prn for anxiety, May repeat once a day, Disp: 12 tablet, Rfl: 0 .  Melatonin 5 MG CAPS, Take by mouth., Disp: , Rfl:  .  naproxen sodium (ANAPROX) 220 MG tablet, Take 440 mg by mouth 2 (two) times daily as needed (for pain)., Disp: , Rfl:  .  calcium carbonate (TUMS - DOSED IN MG ELEMENTAL CALCIUM) 500 MG chewable tablet, Chew 2 tablets by mouth 2 (two) times daily as needed for heartburn. , Disp: , Rfl:  .  famotidine (PEPCID) 20 MG tablet, Take 1 tablet (20 mg total) by mouth 2 (two) times daily. (Patient not taking: Reported on 05/22/2018), Disp: 10 tablet, Rfl: 0 .  nitroGLYCERIN (NITROSTAT) 0.4 MG  SL tablet, Place 1 tablet (0.4 mg total) under the tongue every 5 (five) minutes as needed for chest pain., Disp: 30 tablet, Rfl: 0 .  omeprazole (PRILOSEC) 40 MG capsule, Take 1 capsule (40 mg total) by mouth daily. (Patient not taking: Reported on 05/22/2018), Disp: 30 capsule, Rfl: 3 .  oxyCODONE-acetaminophen (PERCOCET) 5-325 MG per tablet, Take 2 tablets by mouth every 4 (four) hours as needed for pain. (Patient not taking: Reported on 05/22/2018), Disp: 10 tablet, Rfl: 0 .  ranitidine  (ZANTAC) 75 MG tablet, Take 75-150 mg by mouth daily as needed for heartburn. , Disp: , Rfl:  .  sulfamethoxazole-trimethoprim (BACTRIM DS,SEPTRA DS) 800-160 MG tablet, Take 1 tablet by mouth 2 (two) times daily., Disp: 6 tablet, Rfl: 0 Medication Side Effects: none  Family Medical/ Social History: Changes? No  MENTAL HEALTH EXAM:  There were no vitals taken for this visit.There is no height or weight on file to calculate BMI.  General Appearance: unable to assess  Eye Contact:  unable to assess  Speech:  Clear and Coherent  Volume:  Normal  Mood:  Euthymic  Affect:  unable to assess  Thought Process:  Goal Directed  Orientation:  Full (Time, Place, and Person)  Thought Content: Logical   Suicidal Thoughts:  No  Homicidal Thoughts:  No  Memory:  WNL  Judgement:  Good  Insight:  Good  Psychomotor Activity:  unable to assess  Concentration:  Concentration: Good  Recall:  Good  Fund of Knowledge: Good  Language: Good  Assets:  Desire for Improvement  ADL's:  Intact  Cognition: WNL  Prognosis:  Good    DIAGNOSES:    ICD-10-CM   1. Adjustment disorder with anxious mood F43.22   2. Insomnia, unspecified type G47.00     Receiving Psychotherapy: No    RECOMMENDATIONS:  Discussed different options.  I think a big part of his problem is not being able to get adequate rest.  If he could get a full night sleep most nights then I think the anxiety would be lessened. Start trazodone 50 mg, 1/4-1 p.o. nightly as needed sleep.   Sleep hygiene discussed. Continue Xanax 0.25 mg, one half every 6 to [facility] as needed anxiety.  He is aware that this is a controlled substance and can be addictive.  If he is needing it more than a few times a week, we will need to try a different medication in order to prevent the anxiety so he will not need the Xanax as much.  Briefly discussed BuSpar and that would be the next medication we would try.  He verbalizes understanding. He is in hopes that he will  not need to take any medication for long.  He has an appointment with Elio Forget, [facility] next week and he is hoping that counseling will help him understand what is going on and how to treat it, without medications. Return in 4 to 6 weeks.   Melony Overly, PA-C   This record has been created using [employer].  Chart creation errors have been sought, but may not always have been located and corrected. Such creation errors do not reflect on the standard of medical care.

## 2018-05-29 ENCOUNTER — Other Ambulatory Visit: Payer: Self-pay

## 2018-05-29 ENCOUNTER — Ambulatory Visit (INDEPENDENT_AMBULATORY_CARE_PROVIDER_SITE_OTHER): Payer: BLUE CROSS/BLUE SHIELD | Admitting: Mental Health

## 2018-05-29 DIAGNOSIS — F4322 Adjustment disorder with anxiety: Secondary | ICD-10-CM | POA: Diagnosis not present

## 2018-05-29 NOTE — Progress Notes (Signed)
[facility] Counsel[facility] Initial Adult Exam  Name: Troy Mercer Date: 05/29/2018 MRN: [id number] DOB: Apr 24, 1970 PCP: Joycelyn Rua, MD  Time spent: 53 minutes  Guardian/Payee:  none  Paperwork requested:  No   Reason for Visit /Presenting Problem: Pt reports that last year he began coping w/ a lot of changes. 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 He and his wife had bought a new house at that time. 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 Wants to cope w/ stress better, having panic attacks for hte past 6 months. He works from home, lost his internet connection in the home in September due to an outage, this is when he began having his 1st panic attack. Since then, copes w/ elevated anxiety, not as severe as in September. Takes psych meds as rx'd. Takes rx'd xanax about once/week or so. 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  Feels his anxiety lowers after starting the speeches.  He has started walking daily in his neighborhood and this has been helpful. 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 Stated his son is very active normally, distractible often is currently 2nd grade level.    Mental Status Exam:   Appearance:   UTA- unable to assess  Behavior:  UTA  Motor:  UTA  Speech/Language:   Clear and Coherent  Affect:  Congruent  Mood:  normal  Thought process:  normal  Thought content:    WNL  Sensory/Perceptual disturbances:    WNL  Orientation:  oriented to person, place, time/date and situation  Attention:  Good  Concentration:  Good  Memory:  WNL  Fund of knowledge:   Good  Insight:    Good  Judgment:   Good  Impulse Control:  Good   Reported Symptoms:  Panic attacks, anxiety most days, some lower motivation (improving), chest pressure  Risk Assessment: Danger to Self:   No Self-injurious Behavior: No Danger to Others: No Duty to Warn:no Physical Aggression / Violence:No  Access to Firearms a concern: No  Gang Involvement:No  Patient / guardian was educated about steps to take if suicide or homicide risk level increases between visits: no While future psychiatric events cannot be accurately predicted, the patient does not currently require acute inpatient psychiatric care and does not currently meet [facility] involuntary commitment criteria.  Substance Abuse History: Current substance abuse: No     Past Psychiatric History:   No previous psychological problems have been observed Outpatient Providers: none History of Psych Hospitalization: No  Psychological Testing: none  Abuse History: Victim of No., none    Report needed: No. Victim of Neglect:No. Perpetrator of none Witness / Exposure to Domestic Violence: No   Protective Services Involvement: No  Witness to [employer] Violence:  No   Family History:  Family History  Problem Relation Age of Onset  . Hyperlipidemia Other   . Hypertension Other   . Alcohol abuse Other   . Diverticulitis Other   . 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 Allergy     Past Surgical History:  Procedure Laterality Date  . APPENDECTOMY    . HERNIA REPAIR    . SHOULDER SURGERY Right    with bicep tendon repair   . TIBIA FRACTURE SURGERY     left - spiral    Medications: Current Outpatient Medications  Medication Sig Dispense Refill  . ALPRAZolam (XANAX) 0.25 MG tablet 1-2 tabs prn for anxiety, May repeat once a day 12 tablet 0  . calcium carbonate (TUMS - DOSED IN MG ELEMENTAL CALCIUM) 500 MG chewable tablet Chew 2 tablets by mouth 2 (two) times daily as needed for heartburn.     . famotidine (PEPCID) 20 MG tablet Take 1 tablet (20 mg total) by mouth 2 (two) times daily. (Patient not taking: Reported on 05/22/2018) 10 tablet 0  . Melatonin 5 MG CAPS Take by mouth.    . naproxen sodium (ANAPROX) 220 MG tablet Take 440 mg by mouth 2 (two) times daily as needed (for pain).    . nitroGLYCERIN (NITROSTAT) 0.4 MG SL tablet Place 1 tablet (0.4 mg total) under the tongue every 5 (five) minutes as needed for chest pain. 30 tablet 0  . omeprazole (PRILOSEC) 40 MG capsule Take 1 capsule (40 mg total) by mouth daily. (Patient not taking: Reported on 05/22/2018) 30 capsule 3  . oxyCODONE-acetaminophen (PERCOCET) 5-325 MG per tablet Take 2 tablets by mouth every 4 (four) hours as needed for pain. (Patient not taking: Reported on 05/22/2018) 10 tablet 0  . ranitidine (ZANTAC) 75 MG tablet Take 75-150 mg by mouth daily as needed for heartburn.     . sulfamethoxazole-trimethoprim (BACTRIM DS,SEPTRA DS) 800-160 MG tablet Take 1 tablet by mouth 2 (two) times daily. 6 tablet 0  . traZODone (DESYREL) 50 MG tablet Take 0.5-1 tablets (25-50 mg total) by mouth at bedtime as needed for sleep. 30 tablet 1   No current facility-administered medications for this visit.     Allergies  Allergen Reactions  . Codeine Nausea And Vomiting  . Penicillins Itching and Rash    Diagnoses:    ICD-10-CM   1.  Adjustment disorder with anxious mood F43.22      Individualized Plan of Care:  1. Patient to engage psychiatric evaluation and follow medication regimen.  2. Patient to engage in individual psychotherapy.  3. Patient to identify and apply coping skills learned in session to decrease anxiety.  4. Patient to learn and apply CBT, coping skills and strategies learned in session.  5. Patient to contact this office, go to the local ED or call 911 if a crisis or emergency develops between visits.   [name] Sessionhristopher Robey Massmann, [facility]

## 2018-06-12 ENCOUNTER — Other Ambulatory Visit: Payer: Self-pay

## 2018-06-12 ENCOUNTER — Ambulatory Visit (INDEPENDENT_AMBULATORY_CARE_PROVIDER_SITE_OTHER): Payer: BC Managed Care – PPO | Admitting: Mental Health

## 2018-06-12 DIAGNOSIS — G4700 Insomnia, unspecified: Secondary | ICD-10-CM

## 2018-06-12 DIAGNOSIS — F4322 Adjustment disorder with anxiety: Secondary | ICD-10-CM

## 2018-06-12 NOTE — Progress Notes (Signed)
[facility] Counsel[facility] / therapist psychotherapy note  Name: Troy Mercer Date: 06/12/2018 MRN: [id number] DOB: 1970/06/19 PCP: Joycelyn Rua, MD  Time spent: 54 minutes  Guardian/Payee:  none  Paperwork requested:  No   Mental Status Exam:   Appearance:   casual  Behavior:  WNL  Motor:  WNL  `  Clear and Coherent  Affect:  Congruent  Mood:  anxious  Thought process:  normal  Thought content:    WNL  Sensory/Perceptual disturbances:    WNL  Orientation:  oriented to person, place, time/date and situation  Attention:  Good  Concentration:  Good  Memory:  WNL  Fund of knowledge:   Good  Insight:    Good  Judgment:   Good  Impulse Control:  Good   Reported Symptoms:  Panic attacks, anxiety most days, some lower motivation (improving), chest pressure  Risk Assessment: Danger to Self:  No Self-injurious Behavior: No Danger to Others: No Duty to Warn:no Physical Aggression / Violence:No  Access to Firearms a concern: No  Gang Involvement:No  Patient / guardian was educated about steps to take if suicide or homicide risk level increases between visits: no While future psychiatric events cannot be accurately predicted, the patient does not currently require acute inpatient psychiatric care and does not currently meet [facility] involuntary commitment criteria.  Medications: Current Outpatient Medications  Medication Sig Dispense Refill  . ALPRAZolam (XANAX) 0.25 MG tablet 1-2 tabs prn for anxiety, May repeat once a day 12 tablet 0  . calcium carbonate (TUMS - DOSED IN MG ELEMENTAL CALCIUM) 500 MG chewable tablet Chew 2 tablets by mouth 2 (two) times daily as needed for heartburn.     . famotidine (PEPCID) 20 MG tablet Take 1 tablet (20 mg total) by mouth 2 (two) times daily. (Patient not taking: Reported on 05/22/2018) 10 tablet 0  . Melatonin 5 MG CAPS Take by mouth.    . naproxen sodium (ANAPROX) 220 MG tablet Take 440 mg by mouth 2 (two) times daily as needed (for  pain).    . nitroGLYCERIN (NITROSTAT) 0.4 MG SL tablet Place 1 tablet (0.4 mg total) under the tongue every 5 (five) minutes as needed for chest pain. 30 tablet 0  . omeprazole (PRILOSEC) 40 MG capsule Take 1 capsule (40 mg total) by mouth daily. (Patient not taking: Reported on 05/22/2018) 30 capsule 3  . oxyCODONE-acetaminophen (PERCOCET) 5-325 MG per tablet Take 2 tablets by mouth every 4 (four) hours as needed for pain. (Patient not taking: Reported on 05/22/2018) 10 tablet 0  . ranitidine (ZANTAC) 75 MG tablet Take 75-150 mg by mouth daily as needed for heartburn.     . sulfamethoxazole-trimethoprim (BACTRIM DS,SEPTRA DS) 800-160 MG tablet Take 1 tablet by mouth 2 (two) times daily. 6 tablet 0  . traZODone (DESYREL) 50 MG tablet Take 0.5-1 tablets (25-50 mg total) by mouth at bedtime as needed for sleep. 30 tablet 1   No current facility-administered medications for this visit.     Allergies  Allergen Reactions  . Codeine Nausea And Vomiting  . Penicillins Itching and Rash    Diagnoses:    ICD-10-CM   1. Adjustment disorder with anxious mood F43.22   2. Insomnia, unspecified type G47.00    Subjective: Presented for today's session in no distress.  Continue to assess and engage patient in describing stressors and coping.  Patient stated that he has had a stressful week recently, had to take his as needed Xanax to manage.  Cites most  of his anxiety is related to work situations, giving various examples.  Reports a paternal history of anxiety.  His brother passed away 2 years ago, sharing difficulty he had through the grief process.  Reports his family has a history of mental health issues which also included his brother.  Shared how he and his wife remained stable in their marriage.  As working and is 1 of his main ways to relieve stress, patient has been unable to work out with his typical regimen due to fitness and is being closed due to the recent viral pandemic.  He shared how he is taking  some steps slowly at home to engage in some sort of exercise as a way to cope.  We explored this and other ways to cope with stress, providing support and encouraging daily scheduling.  Wants to cope w/ stress better, having panic attacks for hte past 6 months.    Interventions: CBT, supportive therapy, problem solving  Individualized Plan of Care:  1. Patient to engage psychiatric evaluation and follow medication regimen.  2. Patient to engage in individual psychotherapy.  3. Patient to identify and apply coping skills learned in session to decrease anxiety.  4. Patient to learn and apply CBT, coping skills and strategies learned in session.  5. Patient to contact this office, go to the local ED or call 911 if a crisis or emergency develops between visits.   [name] Sessionhristopher Harvir Patry, [facility]

## 2018-06-26 ENCOUNTER — Ambulatory Visit: Payer: BC Managed Care – PPO | Admitting: Mental Health

## 2018-06-26 ENCOUNTER — Other Ambulatory Visit: Payer: Self-pay

## 2018-06-26 DIAGNOSIS — F4322 Adjustment disorder with anxiety: Secondary | ICD-10-CM

## 2018-06-26 NOTE — Progress Notes (Signed)
[facility] Counselor / therapist psychotherapy note  Name: Troy Mercer Date: 06/26/2018 MRN: [id number] DOB: 10/06/1970 PCP: Joycelyn RuaMeyers, Stephen, MD  Time spent: 54 minutes  Guardian/Payee:  none  Paperwork requested:  No   Mental Status Exam:   Appearance:   casual  Behavior:  WNL  Motor:  WNL  `  Clear and Coherent  Affect:  Congruent  Mood:  anxious  Thought process:  normal  Thought content:    WNL  Sensory/Perceptual disturbances:    WNL  Orientation:  oriented to person, place, time/date and situation  Attention:  Good  Concentration:  Good  Memory:  WNL  Fund of knowledge:   Good  Insight:    Good  Judgment:   Good  Impulse Control:  Good   Reported Symptoms:  Panic attacks, anxiety most days, some lower motivation (improving), chest pressure  Risk Assessment: Danger to Self:  No Self-injurious Behavior: No Danger to Others: No Duty to Warn:no Physical Aggression / Violence:No  Access to Firearms a concern: No  Gang Involvement:No  Patient / guardian was educated about steps to take if suicide or homicide risk level increases between visits: no While future psychiatric events cannot be accurately predicted, the patient does not currently require acute inpatient psychiatric care and [facility].  Medications: Current Outpatient Medications  Medication Sig Dispense Refill  . ALPRAZolam (XANAX) 0.25 MG tablet 1-2 tabs prn for anxiety, May repeat once a day 12 tablet 0  . calcium carbonate (TUMS - DOSED IN MG ELEMENTAL CALCIUM) 500 MG chewable tablet Chew 2 tablets by mouth 2 (two) times daily as needed for heartburn.     . famotidine (PEPCID) 20 MG tablet Take 1 tablet (20 mg total) by mouth 2 (two) times daily. (Patient not taking: Reported on 05/22/2018) 10 tablet 0  . Melatonin 5 MG CAPS Take by mouth.    . naproxen sodium (ANAPROX) 220 MG tablet Take 440 mg by mouth 2 (two) times daily as needed (for  pain).    . nitroGLYCERIN (NITROSTAT) 0.4 MG SL tablet Place 1 tablet (0.4 mg total) under the tongue every 5 (five) minutes as needed for chest pain. 30 tablet 0  . omeprazole (PRILOSEC) 40 MG capsule Take 1 capsule (40 mg total) by mouth daily. (Patient not taking: Reported on 05/22/2018) 30 capsule 3  . oxyCODONE-acetaminophen (PERCOCET) 5-325 MG per tablet Take 2 tablets by mouth every 4 (four) hours as needed for pain. (Patient not taking: Reported on 05/22/2018) 10 tablet 0  . ranitidine (ZANTAC) 75 MG tablet Take 75-150 mg by mouth daily as needed for heartburn.     . sulfamethoxazole-trimethoprim (BACTRIM DS,SEPTRA DS) 800-160 MG tablet Take 1 tablet by mouth 2 (two) times daily. 6 tablet 0  . traZODone (DESYREL) 50 MG tablet Take 0.5-1 tablets (25-50 mg total) by mouth at bedtime as needed for sleep. 30 tablet 1   No current facility-administered medications for this visit.     Allergies  Allergen Reactions  . Codeine Nausea And Vomiting  . Penicillins Itching and Rash    Diagnoses:  No diagnosis found.   Subjective: Presented for today's session. Shared progress. More stress at home w/ 658 y/o son. He has been playing video games a lot and patient has encouraged him to be more physically active.  He stated they have spent time together in the family pool and various other activities.  Patient continues to have a level of anxiety at work.  Performance anxiety specifically was discussed with patient.  Patient stated that he feels he copes with this and gave examples.  Discussed experiences prior to the anxiety provoking events which are typically meeting presentations given by patient.  Continue to work with patient from a cognitive behavioral framework.  Following identification and reframing were discussed.  Provided him resource for review between sessions.  Continue to work with patient from a cognitive behavioral.    Interventions: CBT, supportive therapy, problem  solving  Individualized Plan of Care:  1. Patient to engage psychiatric evaluation and follow medication regimen.  2. Patient to engage in individual psychotherapy.  3. Patient to identify and apply coping skills learned in session to decrease anxiety.  4. Patient to learn and apply CBT, coping skills and strategies learned in session.  5. Patient to contact this office, go to the local ED or call 911 if a crisis or emergency develops between visits.   Anson [place], [facility]

## 2018-07-01 DIAGNOSIS — M9903 Segmental and somatic dysfunction of lumbar region: Secondary | ICD-10-CM | POA: Diagnosis not present

## 2018-07-01 DIAGNOSIS — M9902 Segmental and somatic dysfunction of thoracic region: Secondary | ICD-10-CM | POA: Diagnosis not present

## 2018-07-01 DIAGNOSIS — M9904 Segmental and somatic dysfunction of sacral region: Secondary | ICD-10-CM | POA: Diagnosis not present

## 2018-07-01 DIAGNOSIS — M5408 Panniculitis affecting regions of neck and back, sacral and sacrococcygeal region: Secondary | ICD-10-CM | POA: Diagnosis not present

## 2018-07-03 ENCOUNTER — Ambulatory Visit: Payer: BLUE CROSS/BLUE SHIELD | Admitting: Physician Assistant

## 2018-07-13 ENCOUNTER — Ambulatory Visit (INDEPENDENT_AMBULATORY_CARE_PROVIDER_SITE_OTHER): Payer: BC Managed Care – PPO | Admitting: Mental Health

## 2018-07-13 ENCOUNTER — Other Ambulatory Visit: Payer: Self-pay

## 2018-07-13 DIAGNOSIS — F4322 Adjustment disorder with anxiety: Secondary | ICD-10-CM | POA: Diagnosis not present

## 2018-07-13 NOTE — Progress Notes (Signed)
[facility] Counselor / therapist psychotherapy note  Name: Troy Mercer Date: 07/13/2018  MRN: [id number] DOB: 02/22/1970 PCP: Joycelyn RuaMeyers, Stephen, MD  Time spent: 49 minutes  Guardian/Payee:  none  Paperwork requested:  No   Mental Status Exam:   Appearance:   casual  Behavior:  WNL  Motor:  WNL  `  Clear and Coherent  Affect:  Congruent  Mood:  anxious  Thought process:  normal  Thought content:    WNL  Sensory/Perceptual disturbances:    WNL  Orientation:  oriented to person, place, time/date and situation  Attention:  Good  Concentration:  Good  Memory:  WNL  Fund of knowledge:   Good  Insight:    Good  Judgment:   Good  Impulse Control:  Good   Reported Symptoms:  Panic attacks, anxiety most days, some lower motivation (improving), chest pressure  Risk Assessment: Danger to Self:  No Self-injurious Behavior: No Danger to Others: No Duty to Warn:no Physical Aggression / Violence:No  Access to Firearms a concern: No  Gang Involvement:No  Patient / guardian was educated about steps to take if suicide or homicide risk level increases between visits: no While future psychiatric events cannot be accurately predicted, the patient does not currently require acute inpatient psychiatric care and [facility] not [facility].  Medications: Current Outpatient Medications  Medication Sig Dispense Refill  . ALPRAZolam (XANAX) 0.25 MG tablet 1-2 tabs prn for anxiety, May repeat once a day 12 tablet 0  . calcium carbonate (TUMS - DOSED IN MG ELEMENTAL CALCIUM) 500 MG chewable tablet Chew 2 tablets by mouth 2 (two) times daily as needed for heartburn.     . famotidine (PEPCID) 20 MG tablet Take 1 tablet (20 mg total) by mouth 2 (two) times daily. (Patient not taking: Reported on 05/22/2018) 10 tablet 0  . Melatonin 5 MG CAPS Take by mouth.    . naproxen sodium (ANAPROX) 220 MG tablet Take 440 mg by mouth 2 (two) times daily as needed (for  pain).    . nitroGLYCERIN (NITROSTAT) 0.4 MG SL tablet Place 1 tablet (0.4 mg total) under the tongue every 5 (five) minutes as needed for chest pain. 30 tablet 0  . omeprazole (PRILOSEC) 40 MG capsule Take 1 capsule (40 mg total) by mouth daily. (Patient not taking: Reported on 05/22/2018) 30 capsule 3  . oxyCODONE-acetaminophen (PERCOCET) 5-325 MG per tablet Take 2 tablets by mouth every 4 (four) hours as needed for pain. (Patient not taking: Reported on 05/22/2018) 10 tablet 0  . ranitidine (ZANTAC) 75 MG tablet Take 75-150 mg by mouth daily as needed for heartburn.     . sulfamethoxazole-trimethoprim (BACTRIM DS,SEPTRA DS) 800-160 MG tablet Take 1 tablet by mouth 2 (two) times daily. 6 tablet 0  . traZODone (DESYREL) 50 MG tablet Take 0.5-1 tablets (25-50 mg total) by mouth at bedtime as needed for sleep. 30 tablet 1   No current facility-administered medications for this visit.     Allergies  Allergen Reactions  . Codeine Nausea And Vomiting  . Penicillins Itching and Rash    Diagnoses:    ICD-10-CM   1. Adjustment disorder with anxious mood  F43.22      Subjective: Presented for today's session. Shared progress. More stress at home w/ 46 y/o son. Shared how he has to give him multiple redirects.  He has been playing video games a lot with him and other activities. Continued support from his wife. Stress from work  centered around anxiety prior to giving presentations. Does well during the presentations just anxious prior to. Explored some family hx. Continues to cope w/ loss of his brother. Explored some ways he has excelled at his job. Continued to work w/ pt from at CBT approach. Encouraged him to identify neg self talk between sessions.  Interventions: CBT, supportive therapy, problem solving  Individualized Plan of Care:  1. Patient to engage psychiatric evaluation and follow medication regimen.  2. Patient to engage in individual psychotherapy.  3. Patient to identify and  apply coping skills learned in session to decrease anxiety.  4. Patient to learn and apply CBT, coping skills and strategies learned in session.  5. Patient to contact this office, go to the local ED or call 911 if a crisis or emergency develops between visits.   Anson [place], [facility]

## 2018-07-31 ENCOUNTER — Ambulatory Visit (INDEPENDENT_AMBULATORY_CARE_PROVIDER_SITE_OTHER): Payer: BC Managed Care – PPO | Admitting: Mental Health

## 2018-07-31 ENCOUNTER — Other Ambulatory Visit: Payer: Self-pay

## 2018-07-31 DIAGNOSIS — F411 Generalized anxiety disorder: Secondary | ICD-10-CM

## 2018-07-31 NOTE — Progress Notes (Signed)
[facility] Counselor / therapist psychotherapy note  Name: Troy Mercer Date: 07/31/2018  MRN: [id number] DOB: 28-Apr-1970 PCP: Orpah Melter, MD  Time spent: 49 minutes  Guardian/Payee:  none  Paperwork requested:  No   Mental Status Exam:   Appearance:   casual  Behavior:  WNL  Motor:  WNL  `  Clear and Coherent  Affect:  Congruent  Mood:  anxious  Thought process:  normal  Thought content:    WNL  Sensory/Perceptual disturbances:    WNL  Orientation:  oriented to person, place, time/date and situation  Attention:  Good  Concentration:  Good  Memory:  WNL  Fund of knowledge:   Good  Insight:    Good  Judgment:   Good  Impulse Control:  Good   Reported Symptoms:  Panic attacks, anxiety most days, some lower motivation (improving), chest pressure  Risk Assessment: Danger to Self:  No Self-injurious Behavior: No Danger to Others: No Duty to Warn:no Physical Aggression / Violence:No  Access to Firearms a concern: No  Gang Involvement:No  Patient / guardian was educated about steps to take if suicide or homicide risk level increases between visits: no While future psychiatric events cannot be accurately predicted, the patient does not currently require acute inpatient psychiatric care and does not currently meet [facility] involuntary commitment criteria.  Medications: Current Outpatient Medications  Medication Sig Dispense Refill  . ALPRAZolam (XANAX) 0.25 MG tablet 1-2 tabs prn for anxiety, May repeat once a day 12 tablet 0  . calcium carbonate (TUMS - DOSED IN MG ELEMENTAL CALCIUM) 500 MG chewable tablet Chew 2 tablets by mouth 2 (two) times daily as needed for heartburn.     . famotidine (PEPCID) 20 MG tablet Take 1 tablet (20 mg total) by mouth 2 (two) times daily. (Patient not taking: Reported on 05/22/2018) 10 tablet 0  . Melatonin 5 MG CAPS Take by mouth.    . naproxen sodium (ANAPROX) 220 MG tablet Take 440 mg by mouth 2 (two) times daily as needed (for  pain).    . nitroGLYCERIN (NITROSTAT) 0.4 MG SL tablet Place 1 tablet (0.4 mg total) under the tongue every 5 (five) minutes as needed for chest pain. 30 tablet 0  . omeprazole (PRILOSEC) 40 MG capsule Take 1 capsule (40 mg total) by mouth daily. (Patient not taking: Reported on 05/22/2018) 30 capsule 3  . oxyCODONE-acetaminophen (PERCOCET) 5-325 MG per tablet Take 2 tablets by mouth every 4 (four) hours as needed for pain. (Patient not taking: Reported on 05/22/2018) 10 tablet 0  . ranitidine (ZANTAC) 75 MG tablet Take 75-150 mg by mouth daily as needed for heartburn.     . sulfamethoxazole-trimethoprim (BACTRIM DS,SEPTRA DS) 800-160 MG tablet Take 1 tablet by mouth 2 (two) times daily. 6 tablet 0  . traZODone (DESYREL) 50 MG tablet Take 0.5-1 tablets (25-50 mg total) by mouth at bedtime as needed for sleep. 30 tablet 1   No current facility-administered medications for this visit.     Allergies  Allergen Reactions  . Codeine Nausea And Vomiting  . Penicillins Itching and Rash    Diagnoses:    ICD-10-CM   1. Anxiety state  F41.1      Subjective: Presented for today's session. Shared progress.  Shared how he has a presentation later today and has had some anxiety over the last few days which is typical prior to presentations.  Continue to explore thoughts and feelings.  Reminded patient that based on his shared experiences, his  supervisor feels confident in his abilities.  Patient stated that he needs to remind himself of this.  Shared how he has always been highly competitive in sports being athletic growing up.  At times having a low frustration tolerance during athletics.  Shared how he has changed in this way primarily after having their child.  States he wants to set a good example.  Explored some ways to cope and begin meetings with informal discussion.  Patient plans to follow through.  Continued to work w/ pt from at CBT approach. Encouraged him to identify neg self talk between  sessions.  Interventions: CBT, supportive therapy, problem solving  Individualized Plan of Care:  1. Patient to engage psychiatric evaluation and follow medication regimen.  2. Patient to engage in individual psychotherapy.  3. Patient to identify and apply coping skills learned in session to decrease anxiety.  4. Patient to learn and apply CBT, coping skills and strategies learned in session.  5. Patient to contact this office, go to the local ED or call 911 if a crisis or emergency develops between visits.   Troy Mercer, [facility]

## 2018-08-11 ENCOUNTER — Ambulatory Visit: Payer: BC Managed Care – PPO | Admitting: Physician Assistant

## 2018-08-11 ENCOUNTER — Other Ambulatory Visit: Payer: Self-pay

## 2018-08-12 DIAGNOSIS — M9903 Segmental and somatic dysfunction of lumbar region: Secondary | ICD-10-CM | POA: Diagnosis not present

## 2018-08-12 DIAGNOSIS — M9902 Segmental and somatic dysfunction of thoracic region: Secondary | ICD-10-CM | POA: Diagnosis not present

## 2018-08-12 DIAGNOSIS — M5408 Panniculitis affecting regions of neck and back, sacral and sacrococcygeal region: Secondary | ICD-10-CM | POA: Diagnosis not present

## 2018-08-12 DIAGNOSIS — M9904 Segmental and somatic dysfunction of sacral region: Secondary | ICD-10-CM | POA: Diagnosis not present

## 2018-08-28 ENCOUNTER — Other Ambulatory Visit: Payer: Self-pay

## 2018-08-28 ENCOUNTER — Ambulatory Visit (INDEPENDENT_AMBULATORY_CARE_PROVIDER_SITE_OTHER): Payer: BC Managed Care – PPO | Admitting: Mental Health

## 2018-08-28 DIAGNOSIS — F411 Generalized anxiety disorder: Secondary | ICD-10-CM

## 2018-08-28 NOTE — Progress Notes (Signed)
[facility] Counselor / therapist psychotherapy note  Name: Troy Mercer Date: 08/30/2018  MRN: [id number] DOB: 08/12/1970 PCP: Joycelyn RuaMeyers, Stephen, MD  Time spent: 54 minutes  Mental Status Exam:   Appearance:   casual  Behavior:  WNL  Motor:  WNL  `  Clear and Coherent  Affect:  Full range  Mood:  Anxious, pleasant  Thought process:  normal  Thought content:    WNL  Sensory/Perceptual disturbances:    WNL  Orientation:  oriented to person, place, time/date and situation  Attention:  Good  Concentration:  Good  Memory:  WNL  Fund of knowledge:   Good  Insight:    Good  Judgment:   Good  Impulse Control:  Good   Reported Symptoms:  Panic attacks, anxiety most days, some lower motivation (improving), chest pressure  Risk Assessment: Danger to Self:  No Self-injurious Behavior: No Danger to Others: No Duty to Warn:no Physical Aggression / Violence:No  Access to Firearms a concern: No  Gang Involvement:No  Patient / guardian was educated about steps to take if suicide or homicide risk level increases between visits: no While future psychiatric events cannot be accurately predicted, the patient does not currently require acute inpatient psychiatric care and does not [facility] criteria.  Medications: Current Outpatient Medications  Medication Sig Dispense Refill  . ALPRAZolam (XANAX) 0.25 MG tablet 1-2 tabs prn for anxiety, May repeat once a day 12 tablet 0  . calcium carbonate (TUMS - DOSED IN MG ELEMENTAL CALCIUM) 500 MG chewable tablet Chew 2 tablets by mouth 2 (two) times daily as needed for heartburn.     . famotidine (PEPCID) 20 MG tablet Take 1 tablet (20 mg total) by mouth 2 (two) times daily. (Patient not taking: Reported on 05/22/2018) 10 tablet 0  . Melatonin 5 MG CAPS Take by mouth.    . naproxen sodium (ANAPROX) 220 MG tablet Take 440 mg by mouth 2 (two) times daily as needed (for pain).    . nitroGLYCERIN (NITROSTAT) 0.4  MG SL tablet Place 1 tablet (0.4 mg total) under the tongue every 5 (five) minutes as needed for chest pain. 30 tablet 0  . omeprazole (PRILOSEC) 40 MG capsule Take 1 capsule (40 mg total) by mouth daily. (Patient not taking: Reported on 05/22/2018) 30 capsule 3  . oxyCODONE-acetaminophen (PERCOCET) 5-325 MG per tablet Take 2 tablets by mouth every 4 (four) hours as needed for pain. (Patient not taking: Reported on 05/22/2018) 10 tablet 0  . ranitidine (ZANTAC) 75 MG tablet Take 75-150 mg by mouth daily as needed for heartburn.     . sulfamethoxazole-trimethoprim (BACTRIM DS,SEPTRA DS) 800-160 MG tablet Take 1 tablet by mouth 2 (two) times daily. 6 tablet 0  . traZODone (DESYREL) 50 MG tablet Take 0.5-1 tablets (25-50 mg total) by mouth at bedtime as needed for sleep. 30 tablet 1   No current facility-administered medications for this visit.     Allergies  Allergen Reactions  . Codeine Nausea And Vomiting  . Penicillins Itching and Rash    Diagnoses:    ICD-10-CM   1. Anxiety state  F41.1      Subjective: Presented for today's session.  He shared progress, recent events.  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  Patient shared how this is affected his anxiety more so recently.  He continues to have anxiety specifically prior to leading presentations.  This  occurs about once every week or 2.  We reviewed coping.  Discussed thought stopping and assist him in identifying some negative self talk, related to catastrophizing.  Patient plans to continue to identify unhelpful self talk between sessions.  Shared family stressors, mainly related to his son who he feels may cope with ADHD.  Provided him resource for further education to provide support to his son.  Patient plans to follow through on plans.  Continued to work w/ pt from at CBT approach.   Interventions: CBT, supportive therapy, problem solving  Individualized Plan of  Care:  1. Patient to engage psychiatric evaluation and follow medication regimen.  2. Patient to engage in individual psychotherapy.  3. Patient to identify and apply coping skills learned in session to decrease anxiety / panic.  4. Patient to learn and apply CBT, coping skills and strategies learned in session.  5. Patient to contact this office, go to the local ED or call 911 if a crisis or emergency develops between visits.     Anson [place], [facility]

## 2018-09-18 ENCOUNTER — Ambulatory Visit (INDEPENDENT_AMBULATORY_CARE_PROVIDER_SITE_OTHER): Payer: BC Managed Care – PPO | Admitting: Physician Assistant

## 2018-09-18 ENCOUNTER — Other Ambulatory Visit: Payer: Self-pay

## 2018-09-18 ENCOUNTER — Encounter: Payer: Self-pay | Admitting: Physician Assistant

## 2018-09-18 DIAGNOSIS — G4700 Insomnia, unspecified: Secondary | ICD-10-CM

## 2018-09-18 DIAGNOSIS — F4322 Adjustment disorder with anxiety: Secondary | ICD-10-CM | POA: Diagnosis not present

## 2018-09-18 MED ORDER — ALPRAZOLAM 0.25 MG PO TABS: ORAL_TABLET | ORAL | 0 refills | Status: DC

## 2018-09-18 NOTE — Progress Notes (Signed)
[facility] Med Check  Patient ID: Troy Mercer,  MRN: [id number]  PCP: Orpah Melter, MD  Date of Evaluation: 09/18/2018 Time spent:15 minutes  Chief Complaint:  Chief Complaint    Anxiety; Insomnia; Follow-up     Virtual Visit via Telephone Note  I connected with patient by a video enabled telemedicine application or telephone, with their informed consent, and verified patient privacy and that I am speaking with the correct person using two identifiers.  I am private, in my home and the patient is at work.  I discussed the limitations, risks, security and privacy concerns of performing an evaluation and management service by telephone and the availability of in person appointments. I also discussed with the patient that there may be a patient responsible charge related to this service. The patient expressed understanding and agreed to proceed.   I discussed the assessment and treatment plan with the patient. The patient was provided an opportunity to ask questions and all were answered. The patient agreed with the plan and demonstrated an understanding of the instructions.   The patient was advised to call back or seek an in-person evaluation if the symptoms worsen or if the condition fails to improve as anticipated.  I provided 15 minutes of non-face-to-face time during this encounter.  HISTORY/CURRENT STATUS: HPI For routine med check.   Anxiety has been a little bit worse over the past week or so. Just b/c of budget time at work. He has more on his plate right now due to that.  He is seeing Lanetta Inch, [facility] which has been extremely helpful.  Patient is using a lot of techniques that Gerald Stabs has taught him to help with the anxiety.  States that if those techniques do not work, like deep breathing or going for a walk, then he will take half of his Xanax.  He averages 1/2 (0.125 mg) per week to 10 days.  It is effective.  He is able to enjoy things.  Energy and motivation  are good most of the time.  He is not isolating.  Denies suicidal or homicidal thoughts.  At the last visit in May, I added trazodone.  It did help him sleep but he felt extremely drowsy the next day.  He has stopped that and is now using melatonin alone.  Not every night but the majority of the time and it is effective.  Denies dizziness, syncope, seizures, numbness, tingling, tremor, tics, unsteady gait, slurred speech, confusion. Denies muscle or joint pain, stiffness, or dystonia.  Individual Medical History/ Review of Systems: Changes? :No    Past medications for mental health diagnoses include: Zoloft caused insomnia  Allergies: Codeine and Penicillins  Current Medications:  Current Outpatient Medications:  .  ALPRAZolam (XANAX) 0.25 MG tablet, 1-2 tabs prn for anxiety, May repeat once a day, Disp: 30 tablet, Rfl: 0 .  Melatonin 5 MG CAPS, Take by mouth., Disp: , Rfl:  .  naproxen sodium (ANAPROX) 220 MG tablet, Take 440 mg by mouth 2 (two) times daily as needed (for pain)., Disp: , Rfl:  .  calcium carbonate (TUMS - DOSED IN MG ELEMENTAL CALCIUM) 500 MG chewable tablet, Chew 2 tablets by mouth 2 (two) times daily as needed for heartburn. , Disp: , Rfl:  .  famotidine (PEPCID) 20 MG tablet, Take 1 tablet (20 mg total) by mouth 2 (two) times daily. (Patient not taking: Reported on 05/22/2018), Disp: 10 tablet, Rfl: 0 .  nitroGLYCERIN (NITROSTAT) 0.4 MG SL tablet, Place 1  tablet (0.4 mg total) under the tongue every 5 (five) minutes as needed for chest pain. (Patient not taking: Reported on 09/18/2018), Disp: 30 tablet, Rfl: 0 .  omeprazole (PRILOSEC) 40 MG capsule, Take 1 capsule (40 mg total) by mouth daily. (Patient not taking: Reported on 05/22/2018), Disp: 30 capsule, Rfl: 3 .  oxyCODONE-acetaminophen (PERCOCET) 5-325 MG per tablet, Take 2 tablets by mouth every 4 (four) hours as needed for pain. (Patient not taking: Reported on 05/22/2018), Disp: 10 tablet, Rfl: 0 .  ranitidine (ZANTAC)  75 MG tablet, Take 75-150 mg by mouth daily as needed for heartburn. , Disp: , Rfl:  .  sulfamethoxazole-trimethoprim (BACTRIM DS,SEPTRA DS) 800-160 MG tablet, Take 1 tablet by mouth 2 (two) times daily. (Patient not taking: Reported on 09/18/2018), Disp: 6 tablet, Rfl: 0 .  traZODone (DESYREL) 50 MG tablet, Take 0.5-1 tablets (25-50 mg total) by mouth at bedtime as needed for sleep. (Patient not taking: Reported on 09/18/2018), Disp: 30 tablet, Rfl: 1 Medication Side Effects: none  Family Medical/ Social History: Changes? No  MENTAL HEALTH EXAM:  There were no vitals taken for this visit.There is no height or weight on file to calculate BMI.  General Appearance: unable to assess  Eye Contact:  unable to assess  Speech:  Clear and Coherent  Volume:  Normal  Mood:  Euthymic  Affect:  unable to assess  Thought Process:  Goal Directed  Orientation:  Full (Time, Place, and Person)  Thought Content: Logical   Suicidal Thoughts:  No  Homicidal Thoughts:  No  Memory:  WNL  Judgement:  Good  Insight:  Good  Psychomotor Activity:  unable to assess  Concentration:  Concentration: Good  Recall:  Good  Fund of Knowledge: Good  Language: Good  Assets:  Desire for Improvement  ADL's:  Intact  Cognition: WNL  Prognosis:  Good    DIAGNOSES:    ICD-10-CM   1. Adjustment disorder with anxious mood  F43.22   2. Insomnia, unspecified type  G47.00     Receiving Psychotherapy: Yes [facility]io Forg[facility]ris Andrews, [facility]   RECOMMENDATIONS:  PDMP was reviewed. Continue Xanax 0.25 mg, directions on prescription or 1-2 daily as needed, but in reality he is taking 1/2 p.o. only once a week or thereabouts. Continue melatonin 5 mg nightly as needed sleep. Continue therapy with Elio Forgethris Andrews, [facility]. Return in 6 months.  Melony Overlyeresa Hurst, PA-C   This record has been created using [employer] software.  Chart creation errors have been sought, but may not always have been located and corrected. Such creation errors do not  reflect on the standard of medical care.

## 2018-09-23 DIAGNOSIS — M9904 Segmental and somatic dysfunction of sacral region: Secondary | ICD-10-CM | POA: Diagnosis not present

## 2018-09-23 DIAGNOSIS — M9903 Segmental and somatic dysfunction of lumbar region: Secondary | ICD-10-CM | POA: Diagnosis not present

## 2018-09-23 DIAGNOSIS — M5408 Panniculitis affecting regions of neck and back, sacral and sacrococcygeal region: Secondary | ICD-10-CM | POA: Diagnosis not present

## 2018-09-23 DIAGNOSIS — M9902 Segmental and somatic dysfunction of thoracic region: Secondary | ICD-10-CM | POA: Diagnosis not present

## 2018-09-25 ENCOUNTER — Ambulatory Visit (INDEPENDENT_AMBULATORY_CARE_PROVIDER_SITE_OTHER): Payer: BC Managed Care – PPO | Admitting: Mental Health

## 2018-09-25 ENCOUNTER — Other Ambulatory Visit: Payer: Self-pay

## 2018-09-25 DIAGNOSIS — F411 Generalized anxiety disorder: Secondary | ICD-10-CM

## 2018-10-02 NOTE — Progress Notes (Signed)
[facility] Counselor / therapist psychotherapy note  Name: Troy Mercer Date: 09/25/18 MRN: [id number] DOB: 10-Jan-1970 PCP: Joycelyn Rua, MD  Time spent: 55 minutes  Mental Status Exam:   Appearance:   casual  Behavior:  WNL  Motor:  WNL  `  Clear and Coherent  Affect:  Full range  Mood:  Anxious, pleasant  Thought process:  normal  Thought content:    WNL  Sensory/Perceptual disturbances:    WNL  Orientation:  oriented to person, place, time/date and situation  Attention:  Good  Concentration:  Good  Memory:  WNL  Fund of knowledge:   Good  Insight:    Good  Judgment:   Good  Impulse Control:  Good   Reported Symptoms:  Panic attacks, anxiety most days, some lower motivation (improving), chest pressure  Risk Assessment: Danger to Self:  No Self-injurious Behavior: No Danger to Others: No Duty to Warn:no Physical Aggression / Violence:No  Access to Firearms a concern: No  Gang Involvement:No  Patient / guardian was educated about steps to take if suicide or homicide risk level increases between visits: no While future psychiatric events cannot be accurately predicted, the patient does not currently require acute inpatient psychiatric care and does not currently meet [facility] involuntary commitment criteria.  Medications: Current Outpatient Medications  Medication Sig Dispense Refill  . ALPRAZolam (XANAX) 0.25 MG tablet 1-2 tabs prn for anxiety, May repeat once a day 30 tablet 0  . calcium carbonate (TUMS - DOSED IN MG ELEMENTAL CALCIUM) 500 MG chewable tablet Chew 2 tablets by mouth 2 (two) times daily as needed for heartburn.     . famotidine (PEPCID) 20 MG tablet Take 1 tablet (20 mg total) by mouth 2 (two) times daily. (Patient not taking: Reported on 05/22/2018) 10 tablet 0  . Melatonin 5 MG CAPS Take by mouth.    . naproxen sodium (ANAPROX) 220 MG tablet Take 440 mg by mouth 2 (two) times daily as needed (for pain).    . nitroGLYCERIN (NITROSTAT) 0.4 MG  SL tablet Place 1 tablet (0.4 mg total) under the tongue every 5 (five) minutes as needed for chest pain. (Patient not taking: Reported on 09/18/2018) 30 tablet 0  . omeprazole (PRILOSEC) 40 MG capsule Take 1 capsule (40 mg total) by mouth daily. (Patient not taking: Reported on 05/22/2018) 30 capsule 3  . oxyCODONE-acetaminophen (PERCOCET) 5-325 MG per tablet Take 2 tablets by mouth every 4 (four) hours as needed for pain. (Patient not taking: Reported on 05/22/2018) 10 tablet 0  . ranitidine (ZANTAC) 75 MG tablet Take 75-150 mg by mouth daily as needed for heartburn.     . sulfamethoxazole-trimethoprim (BACTRIM DS,SEPTRA DS) 800-160 MG tablet Take 1 tablet by mouth 2 (two) times daily. (Patient not taking: Reported on 09/18/2018) 6 tablet 0  . traZODone (DESYREL) 50 MG tablet Take 0.5-1 tablets (25-50 mg total) by mouth at bedtime as needed for sleep. (Patient not taking: Reported on 09/18/2018) 30 tablet 1   No current facility-administered medications for this visit.     Allergies  Allergen Reactions  . Codeine Nausea And Vomiting  . Penicillins Itching and Rash    Diagnoses:  No diagnosis found.   Subjective: Presented for today's session in no apparent distress. He shared work events, some anxiety persists, specifically when having to give presentations or lead meetings. He stated he tries to remind himself of facts, how he does well in leading the meetings and how he is told this by others.  He has been given more work responsibilities based on his quality of work. He stated he feels he is adjusting well to this position as he has been with the company for several months now. reviewed thought stopping and to "fact check" his thoughts that he feels lead to anxiety.  He plans to continue to identify unhelpful self talk between sessions. Family is well, marital relationship continues to go well, wife works a lot. They have support from other family members to help their son w/ some school work.   Continued to work w/ pt from at CBT, strengths based approach.   Interventions: CBT, supportive therapy, problem solving  Individualized Plan of Care:  1. Patient to engage psychiatric evaluation and follow medication regimen.  2. Patient to engage in individual psychotherapy.  3. Patient to identify and apply coping skills learned in session to decrease anxiety / panic.  4. Patient to learn and apply CBT, coping skills and strategies learned in session.  5. Patient to contact this office, go to the local ED or call 911 if a crisis or emergency develops between visits.     Anson [place], [facility]

## 2018-10-16 ENCOUNTER — Other Ambulatory Visit: Payer: Self-pay

## 2018-10-16 ENCOUNTER — Ambulatory Visit (INDEPENDENT_AMBULATORY_CARE_PROVIDER_SITE_OTHER): Payer: BC Managed Care – PPO | Admitting: Mental Health

## 2018-10-16 DIAGNOSIS — F411 Generalized anxiety disorder: Secondary | ICD-10-CM

## 2018-10-16 NOTE — Progress Notes (Signed)
[facility] Counselor / therapist psychotherapy note  Name: Troy Mercer Date: 10/16/18 MRN: [id number] DOB: July 10, 1970 PCP: Joycelyn Rua, MD  Time spent: 55 minutes  Mental Status Exam:   Appearance:   casual  Behavior:  WNL  Motor:  WNL  `  Clear and Coherent  Affect:  Full range  Mood:  Anxious, pleasant  Thought process:  normal  Thought content:    WNL  Sensory/Perceptual disturbances:    WNL  Orientation:  oriented to person, place, time/date and situation  Attention:  Good  Concentration:  Good  Memory:  WNL  Fund of knowledge:   Good  Insight:    Good  Judgment:   Good  Impulse Control:  Good   Reported Symptoms:  Panic attacks, anxiety most days, some lower motivation (improving), chest pressure  Risk Assessment: Danger to Self:  No Self-injurious Behavior: No Danger to Others: No Duty to Warn:no Physical Aggression / Violence:No  Access to Firearms a concern: No  Gang Involvement:No  Patient / guardian was educated about steps to take if suicide or homicide risk level increases between visits: no While future psychiatric events cannot be accurately predicted, the patient does not currently require acute inpatient psychiatric care and does not currently meet [facility] involuntary commitment criteria.  Medications: Current Outpatient Medications  Medication Sig Dispense Refill  . ALPRAZolam (XANAX) 0.25 MG tablet 1-2 tabs prn for anxiety, May repeat once a day 30 tablet 0  . calcium carbonate (TUMS - DOSED IN MG ELEMENTAL CALCIUM) 500 MG chewable tablet Chew 2 tablets by mouth 2 (two) times daily as needed for heartburn.     . famotidine (PEPCID) 20 MG tablet Take 1 tablet (20 mg total) by mouth 2 (two) times daily. (Patient not taking: Reported on 05/22/2018) 10 tablet 0  . Melatonin 5 MG CAPS Take by mouth.    . naproxen sodium (ANAPROX) 220 MG tablet Take 440 mg by mouth 2 (two) times daily as needed (for pain).    . nitroGLYCERIN (NITROSTAT) 0.4 MG  SL tablet Place 1 tablet (0.4 mg total) under the tongue every 5 (five) minutes as needed for chest pain. (Patient not taking: Reported on 09/18/2018) 30 tablet 0  . omeprazole (PRILOSEC) 40 MG capsule Take 1 capsule (40 mg total) by mouth daily. (Patient not taking: Reported on 05/22/2018) 30 capsule 3  . oxyCODONE-acetaminophen (PERCOCET) 5-325 MG per tablet Take 2 tablets by mouth every 4 (four) hours as needed for pain. (Patient not taking: Reported on 05/22/2018) 10 tablet 0  . ranitidine (ZANTAC) 75 MG tablet Take 75-150 mg by mouth daily as needed for heartburn.     . sulfamethoxazole-trimethoprim (BACTRIM DS,SEPTRA DS) 800-160 MG tablet Take 1 tablet by mouth 2 (two) times daily. (Patient not taking: Reported on 09/18/2018) 6 tablet 0  . traZODone (DESYREL) 50 MG tablet Take 0.5-1 tablets (25-50 mg total) by mouth at bedtime as needed for sleep. (Patient not taking: Reported on 09/18/2018) 30 tablet 1   No current facility-administered medications for this visit.     Allergies  Allergen Reactions  . Codeine Nausea And Vomiting  . Penicillins Itching and Rash    Diagnoses:    ICD-10-CM   1. Anxiety state  F41.1      Subjective: Presented for today's session in no apparent distress.  Discussed progress.  He stated that he and his wife plan to allow his son to return to school part-time as planned by the [employer].  Concerns  about the pandemic were discussed.  Challenges of ensuring his son makes progress on line with academics was shared.  Patient tries to be consistent and he and his wife have help from grandparents which has been significantly helpful.  He continues to have some anxiety at work particularly when giving presentations or about [employer].  Patient shared how he has tried to ground himself with rational self talk versus catastrophizing which he identified as what he may do the most often.  Patient was reminded of his consistency in this area per his report,  how he has been meeting various objectives at work which has led him towards potential promotions and more responsibility.  Interventions: CBT, supportive therapy, problem solving  Individualized Plan of Care:  1. Patient to engage psychiatric evaluation and follow medication regimen.  2. Patient to engage in individual psychotherapy.  3. Patient to identify and apply coping skills learned in session to decrease anxiety / panic.  4. Patient to learn and apply CBT, coping skills and strategies learned in session.  5. Patient to contact this office, go to the local ED or call 911 if a crisis or emergency develops between visits.     Anson [place], [facility]

## 2018-10-28 DIAGNOSIS — M9902 Segmental and somatic dysfunction of thoracic region: Secondary | ICD-10-CM | POA: Diagnosis not present

## 2018-10-28 DIAGNOSIS — M5408 Panniculitis affecting regions of neck and back, sacral and sacrococcygeal region: Secondary | ICD-10-CM | POA: Diagnosis not present

## 2018-10-28 DIAGNOSIS — M9904 Segmental and somatic dysfunction of sacral region: Secondary | ICD-10-CM | POA: Diagnosis not present

## 2018-10-28 DIAGNOSIS — M9903 Segmental and somatic dysfunction of lumbar region: Secondary | ICD-10-CM | POA: Diagnosis not present

## 2018-11-20 ENCOUNTER — Ambulatory Visit: Payer: BC Managed Care – PPO | Admitting: Mental Health

## 2018-12-02 DIAGNOSIS — M9903 Segmental and somatic dysfunction of lumbar region: Secondary | ICD-10-CM | POA: Diagnosis not present

## 2018-12-02 DIAGNOSIS — M5408 Panniculitis affecting regions of neck and back, sacral and sacrococcygeal region: Secondary | ICD-10-CM | POA: Diagnosis not present

## 2018-12-02 DIAGNOSIS — M9904 Segmental and somatic dysfunction of sacral region: Secondary | ICD-10-CM | POA: Diagnosis not present

## 2018-12-02 DIAGNOSIS — M9902 Segmental and somatic dysfunction of thoracic region: Secondary | ICD-10-CM | POA: Diagnosis not present

## 2018-12-18 ENCOUNTER — Other Ambulatory Visit: Payer: Self-pay

## 2018-12-18 ENCOUNTER — Ambulatory Visit (INDEPENDENT_AMBULATORY_CARE_PROVIDER_SITE_OTHER): Payer: BC Managed Care – PPO | Admitting: Mental Health

## 2018-12-18 DIAGNOSIS — F411 Generalized anxiety disorder: Secondary | ICD-10-CM

## 2018-12-18 NOTE — Progress Notes (Signed)
[facility] Counselor / therapist psychotherapy note  Name: Troy Mercer Date: 12/18/18 MRN: [id number]  DOB: 1970-09-19  PCP: Orpah Melter, MD  Time spent: 54 minutes Treatment: individual therapy  Virtual Visit via Telephone Note Connected with patient by a video enabled telemedicine/telehealth application or telephone, with their informed consent, and verified patient privacy and that I am speaking with the correct person using two identifiers. I discussed the limitations, risks, security and privacy concerns of performing psychotherapy and management service by telephone and the availability of in person appointments. I also discussed with the patient that there may be a patient responsible charge related to this service. The patient expressed understanding and agreed to proceed. I discussed the treatment planning with the patient. The patient was provided an opportunity to ask questions and all were answered. The patient agreed with the plan and demonstrated an understanding of the instructions. The patient was advised to call  our office if  symptoms worsen or feel they are in a crisis state and need immediate contact.   Therapist Location: office Patient Location: home  Mental Status Exam:   Appearance:   casual  Behavior:  WNL  Motor:  WNL  `  Clear and Coherent  Affect:  Full range  Mood:  Anxious, pleasant  Thought process:  normal  Thought content:    WNL  Sensory/Perceptual disturbances:    WNL  Orientation:  oriented to person, place, time/date and situation  Attention:  Good  Concentration:  Good  Memory:  WNL  Fund of knowledge:   Good  Insight:    Good  Judgment:   Good  Impulse Control:  Good   Reported Symptoms:  Panic attacks, anxiety most days, some lower motivation (improving), chest pressure  Risk Assessment: Danger to Self:  No Self-injurious Behavior: No Danger to Others: No Duty to Warn:no Physical Aggression / Violence:No  Access to Firearms a  concern: No  Gang Involvement:No  Patient / guardian was educated about steps to take if suicide or homicide risk level increases between visits: no While future psychiatric events cannot be accurately predicted, the patient does not currently require acute inpatient psychiatric care and does not currently meet [facility] involuntary commitment criteria.  Medications: Current Outpatient Medications  Medication Sig Dispense Refill  . ALPRAZolam (XANAX) 0.25 MG tablet 1-2 tabs prn for anxiety, May repeat once a day 30 tablet 0  . calcium carbonate (TUMS - DOSED IN MG ELEMENTAL CALCIUM) 500 MG chewable tablet Chew 2 tablets by mouth 2 (two) times daily as needed for heartburn.     . famotidine (PEPCID) 20 MG tablet Take 1 tablet (20 mg total) by mouth 2 (two) times daily. (Patient not taking: Reported on 05/22/2018) 10 tablet 0  . Melatonin 5 MG CAPS Take by mouth.    . naproxen sodium (ANAPROX) 220 MG tablet Take 440 mg by mouth 2 (two) times daily as needed (for pain).    . nitroGLYCERIN (NITROSTAT) 0.4 MG SL tablet Place 1 tablet (0.4 mg total) under the tongue every 5 (five) minutes as needed for chest pain. (Patient not taking: Reported on 09/18/2018) 30 tablet 0  . omeprazole (PRILOSEC) 40 MG capsule Take 1 capsule (40 mg total) by mouth daily. (Patient not taking: Reported on 05/22/2018) 30 capsule 3  . oxyCODONE-acetaminophen (PERCOCET) 5-325 MG per tablet Take 2 tablets by mouth every 4 (four) hours as needed for pain. (Patient not taking: Reported on 05/22/2018) 10 tablet 0  . ranitidine (ZANTAC) 75 MG tablet  Take 75-150 mg by mouth daily as needed for heartburn.     . sulfamethoxazole-trimethoprim (BACTRIM DS,SEPTRA DS) 800-160 MG tablet Take 1 tablet by mouth 2 (two) times daily. (Patient not taking: Reported on 09/18/2018) 6 tablet 0  . traZODone (DESYREL) 50 MG tablet Take 0.5-1 tablets (25-50 mg total) by mouth at bedtime as needed for sleep. (Patient not taking: Reported on 09/18/2018)  30 tablet 1   No current facility-administered medications for this visit.    Allergies  Allergen Reactions  . Codeine Nausea And Vomiting  . Penicillins Itching and Rash    Diagnoses:    ICD-10-CM   1. Anxiety state  F41.1      Subjective:  Patient engaged in telehealth session. He shared recent progress. He stated he has been stressed in part due to concerns about his son.  H e stated that due to his work schedule and his wife's, they have minimal time to help him with school assignments.  His son is highly intelligent, however needs the support primarily due to his age.  They had some family support in the past, however due to the increasing pandemic numbers, they have had to rely on helping him themselves alone.  He shared some specifics, now he can be prone to anger at times and how he and his wife attempt to modify his behavior.  He plans to have him engage in an assessment with a therapist for counseling soon.  Work-related stress has remained, anxiety levels remain as well however, he shared some attempts to do stress by engaging in exercise again.  Through discovery, he identified these challenges and how they affect him, his anxiety levels and stress.  We reviewed coping, cognitive filtering focusing on what he can effectively change and that as he cannot directly or immediately change or fix.  Interventions: CBT, supportive therapy, problem solving  Individualized Plan of Care:  1. Patient to engage psychiatric evaluation and follow medication regimen.  2. Patient to engage in individual psychotherapy.  3. Patient to identify and apply coping skills learned in session to decrease anxiety / panic.  4. Patient to learn and apply CBT, coping skills and strategies learned in session.  5. Patient to contact this office, go to the local ED or call 911 if a crisis or emergency develops between visits.   Progress: Progressing  [name], [facility]

## 2019-01-05 DIAGNOSIS — M9903 Segmental and somatic dysfunction of lumbar region: Secondary | ICD-10-CM | POA: Diagnosis not present

## 2019-01-05 DIAGNOSIS — M9904 Segmental and somatic dysfunction of sacral region: Secondary | ICD-10-CM | POA: Diagnosis not present

## 2019-01-05 DIAGNOSIS — M9902 Segmental and somatic dysfunction of thoracic region: Secondary | ICD-10-CM | POA: Diagnosis not present

## 2019-01-05 DIAGNOSIS — M5408 Panniculitis affecting regions of neck and back, sacral and sacrococcygeal region: Secondary | ICD-10-CM | POA: Diagnosis not present

## 2019-01-13 ENCOUNTER — Ambulatory Visit: Payer: BC Managed Care – PPO | Admitting: Mental Health

## 2019-02-03 ENCOUNTER — Ambulatory Visit (INDEPENDENT_AMBULATORY_CARE_PROVIDER_SITE_OTHER): Payer: BC Managed Care – PPO | Admitting: Mental Health

## 2019-02-03 ENCOUNTER — Other Ambulatory Visit: Payer: Self-pay

## 2019-02-03 DIAGNOSIS — F411 Generalized anxiety disorder: Secondary | ICD-10-CM

## 2019-02-03 NOTE — Progress Notes (Signed)
[facility] Counselor / therapist psychotherapy note  Name: Troy Mercer Date: 02/04/19 MRN: [id number]  DOB: 1970-12-01  PCP: Troy Melter, MD  Time spent: 55 minutes Treatment: individual therapy  Mental Status Exam:   Appearance:   casual  Behavior:  WNL  Motor:  WNL  `  Clear and Coherent  Affect:  Full range  Mood:  Anxious, pleasant  Thought process:  normal  Thought content:    WNL  Sensory/Perceptual disturbances:    WNL  Orientation:  oriented to person, place, time/date and situation  Attention:  Good  Concentration:  Good  Memory:  WNL  Fund of knowledge:   Good  Insight:    Good  Judgment:   Good  Impulse Control:  Good   Reported Symptoms:  Panic attacks, anxiety most days, some lower motivation (improving), chest pressure  Risk Assessment: Danger to Self:  No Self-injurious Behavior: No Danger to Others: No Duty to Warn:no Physical Aggression / Violence:No  Access to Firearms a concern: No  Gang Involvement:No  Patient / guardian was educated about steps to take if suicide or homicide risk level increases between visits: no While future psychiatric events cannot be accurately predicted, the patient does not currently require acute inpatient psychiatric care and does not currently meet [facility] involuntary commitment criteria.  Medications: Current Outpatient Medications  Medication Sig Dispense Refill  . ALPRAZolam (XANAX) 0.25 MG tablet 1-2 tabs prn for anxiety, May repeat once a day 30 tablet 0  . calcium carbonate (TUMS - DOSED IN MG ELEMENTAL CALCIUM) 500 MG chewable tablet Chew 2 tablets by mouth 2 (two) times daily as needed for heartburn.     . famotidine (PEPCID) 20 MG tablet Take 1 tablet (20 mg total) by mouth 2 (two) times daily. (Patient not taking: Reported on 05/22/2018) 10 tablet 0  . Melatonin 5 MG CAPS Take by mouth.    . naproxen sodium (ANAPROX) 220 MG tablet Take 440 mg by mouth 2 (two) times daily as needed (for pain).    .  nitroGLYCERIN (NITROSTAT) 0.4 MG SL tablet Place 1 tablet (0.4 mg total) under the tongue every 5 (five) minutes as needed for chest pain. (Patient not taking: Reported on 09/18/2018) 30 tablet 0  . omeprazole (PRILOSEC) 40 MG capsule Take 1 capsule (40 mg total) by mouth daily. (Patient not taking: Reported on 05/22/2018) 30 capsule 3  . oxyCODONE-acetaminophen (PERCOCET) 5-325 MG per tablet Take 2 tablets by mouth every 4 (four) hours as needed for pain. (Patient not taking: Reported on 05/22/2018) 10 tablet 0  . ranitidine (ZANTAC) 75 MG tablet Take 75-150 mg by mouth daily as needed for heartburn.     . sulfamethoxazole-trimethoprim (BACTRIM DS,SEPTRA DS) 800-160 MG tablet Take 1 tablet by mouth 2 (two) times daily. (Patient not taking: Reported on 09/18/2018) 6 tablet 0  . traZODone (DESYREL) 50 MG tablet Take 0.5-1 tablets (25-50 mg total) by mouth at bedtime as needed for sleep. (Patient not taking: Reported on 09/18/2018) 30 tablet 1   No current facility-administered medications for this visit.    Allergies  Allergen Reactions  . Codeine Nausea And Vomiting  . Penicillins Itching and Rash    Diagnoses:  No diagnosis found.   Subjective:  Patient engaged in session in no apparent distress.  Is been approximately 1 month since last session.  He shared how his son is now going to school on site and how this is not helpful for the needed structure.  He stated his  also has helped him and his wife with their daily schedules, keeping up with work tasks more effectively.  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  Continues to have some subsequent anxiety particularly when getting presentations and meetings but this quickly dissipates after he begins.  He shared how he is trying to follow through on identifying helpful,  calming realistic self talk to manage his anxiety during these situations.  Patient went on to process feelings related to family low self-worth without related to his ongoing struggles with anxiety over the years.  Patient was encouraged to recognize core beliefs about himself separate from his diagnosis.  He shared his family's lengthy history of mental health some family abuse regarding mental health and how this plays a role the stigma associated.  Plans to be mindful of this concept between sessions as an effort toward self-care while also integrating some exercise to improve stress levels.  Interventions: CBT, supportive therapy, problem solving  Individualized Plan of Care:  1. Patient to engage psychiatric evaluation and follow medication regimen.  2. Patient to engage in individual psychotherapy.  3. Patient to identify and apply coping skills learned in session to decrease anxiety / panic.  4. Patient to learn and apply CBT, coping skills and strategies learned in session.  5. Patient to contact this office, go to the local ED or call 911 if a crisis or emergency develops between visits.   Progress: Progressing  [name], [facility]

## 2019-02-17 DIAGNOSIS — M9902 Segmental and somatic dysfunction of thoracic region: Secondary | ICD-10-CM | POA: Diagnosis not present

## 2019-02-17 DIAGNOSIS — M9903 Segmental and somatic dysfunction of lumbar region: Secondary | ICD-10-CM | POA: Diagnosis not present

## 2019-02-17 DIAGNOSIS — M9904 Segmental and somatic dysfunction of sacral region: Secondary | ICD-10-CM | POA: Diagnosis not present

## 2019-02-17 DIAGNOSIS — M5408 Panniculitis affecting regions of neck and back, sacral and sacrococcygeal region: Secondary | ICD-10-CM | POA: Diagnosis not present

## 2019-03-18 ENCOUNTER — Encounter: Payer: Self-pay | Admitting: Physician Assistant

## 2019-03-18 ENCOUNTER — Ambulatory Visit (INDEPENDENT_AMBULATORY_CARE_PROVIDER_SITE_OTHER): Payer: BC Managed Care – PPO | Admitting: Physician Assistant

## 2019-03-18 ENCOUNTER — Other Ambulatory Visit: Payer: Self-pay

## 2019-03-18 DIAGNOSIS — F4010 Social phobia, unspecified: Secondary | ICD-10-CM

## 2019-03-18 MED ORDER — ALPRAZOLAM 0.25 MG PO TABS: 0.1250 mg | ORAL_TABLET | Freq: Two times a day (BID) | ORAL | 1 refills | Status: DC | PRN

## 2019-03-18 NOTE — Progress Notes (Signed)
[facility] Med Check  Patient ID: Troy Mercer,  MRN: [id number]  PCP: Orpah Melter, MD  Date of Evaluation: 03/18/2019 Time spent:20 minutes  Chief Complaint:  Chief Complaint    Anxiety; Insomnia; Medication Refill      HISTORY/CURRENT STATUS: HPI For routine med check.  Doing well.  He has significant social anxiety and has to speak in public, gets super anxious.  Will get cold sweats, have palpitations.  The Xanax helps a lot. Typically only uses it 2-3 times a wk. Work is very stressful lately too. 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   Patient denies loss of interest in usual activities and is able to enjoy things.  Denies decreased energy or motivation.  Appetite has not changed.  No extreme sadness, tearfulness, or feelings of hopelessness.  Denies any changes in concentration, making decisions or remembering things.  Denies suicidal or homicidal thoughts.  Denies dizziness, syncope, seizures, numbness, tingling, tremor, tics, unsteady gait, slurred speech, confusion. Denies muscle or joint pain, stiffness, or dystonia.  Individual Medical History/ Review of Systems: Changes? :No    Past medications for mental health diagnoses include: Zoloft caused insomnia  Allergies: Codeine and Penicillins  Current Medications:  Current Outpatient Medications:  .  ALPRAZolam (XANAX) 0.25 MG tablet, Take 0.5-1 tablets (0.125-0.25 mg total) by mouth 2 (two) times daily as needed for anxiety., Disp: 30 tablet, Rfl: 1 .  calcium carbonate (TUMS - DOSED IN MG ELEMENTAL CALCIUM) 500 MG chewable tablet, Chew 2 tablets by mouth 2 (two) times daily as needed for heartburn. , Disp: , Rfl:  .  famotidine (PEPCID) 20 MG tablet, Take 1 tablet (20 mg total) by mouth 2 (two) times daily., Disp: 10 tablet, Rfl: 0 .  Melatonin 5 MG CAPS, Take by mouth., Disp: , Rfl:  .  naproxen sodium (ANAPROX) 220 MG tablet, Take 440 mg by mouth 2 (two) times daily as  needed (for pain)., Disp: , Rfl:  .  nitroGLYCERIN (NITROSTAT) 0.4 MG SL tablet, Place 1 tablet (0.4 mg total) under the tongue every 5 (five) minutes as needed for chest pain. (Patient not taking: Reported on 09/18/2018), Disp: 30 tablet, Rfl: 0 .  omeprazole (PRILOSEC) 40 MG capsule, Take 1 capsule (40 mg total) by mouth daily. (Patient not taking: Reported on 05/22/2018), Disp: 30 capsule, Rfl: 3 .  oxyCODONE-acetaminophen (PERCOCET) 5-325 MG per tablet, Take 2 tablets by mouth every 4 (four) hours as needed for pain. (Patient not taking: Reported on 05/22/2018), Disp: 10 tablet, Rfl: 0 .  ranitidine (ZANTAC) 75 MG tablet, Take 75-150 mg by mouth daily as needed for heartburn. , Disp: , Rfl:  .  sulfamethoxazole-trimethoprim (BACTRIM DS,SEPTRA DS) 800-160 MG tablet, Take 1 tablet by mouth 2 (two) times daily. (Patient not taking: Reported on 09/18/2018), Disp: 6 tablet, Rfl: 0 .  traZODone (DESYREL) 50 MG tablet, Take 0.5-1 tablets (25-50 mg total) by mouth at bedtime as needed for sleep. (Patient not taking: Reported on 09/18/2018), Disp: 30 tablet, Rfl: 1 Medication Side Effects: none  Family Medical/ Social History: Changes? Has had several deaths in family in the last 6 months. An aunt and his mother-in-law.   MENTAL HEALTH EXAM:  There were no vitals taken for this visit.There is no height or weight on file to calculate BMI.  General Appearance: Casual, Neat and Well Groomed  Eye Contact:  Good  Speech:  Clear and Coherent and Normal Rate  Volume:  Normal  Mood:  Euthymic  Affect:  Appropriate  Thought Process:  Goal Directed and Descriptions of Associations: Intact  Orientation:  Full (Time, Place, and Person)  Thought Content: Logical   Suicidal Thoughts:  No  Homicidal Thoughts:  No  Memory:  WNL  Judgement:  Good  Insight:  Good  Psychomotor Activity:  Normal  Concentration:  Concentration: Good  Recall:  Good  Fund of Knowledge: Good  Language: Good  Assets:  Desire for  Improvement  ADL's:  Intact  Cognition: WNL  Prognosis:  Good    DIAGNOSES:    ICD-10-CM   1. Social anxiety disorder  F40.10     Receiving Psychotherapy: Yes Elio Forget, [facility]   RECOMMENDATIONS:  PDMP reviewed. I spent 20 mins w/ him.  Continue Xanax 0.25 mg, 1/2 -1 po bid prn.   Cont therapy with Elio Forget, [facility] Return in 6 months.  Melony Overly, PA-C

## 2019-03-19 ENCOUNTER — Ambulatory Visit (INDEPENDENT_AMBULATORY_CARE_PROVIDER_SITE_OTHER): Payer: BC Managed Care – PPO | Admitting: Mental Health

## 2019-03-19 DIAGNOSIS — F4010 Social phobia, unspecified: Secondary | ICD-10-CM

## 2019-03-19 NOTE — Progress Notes (Signed)
[facility] Counselor / therapist psychotherapy note  Name: MIRACLE CRIADO Date: 03/19/19 MRN: [id number]  DOB: 07-14-1970  PCP: Orpah Melter, MD  Time spent: 51 minutes Treatment: individual therapy  Mental Status Exam:   Appearance:   casual  Behavior:  WNL  Motor:  WNL  `  Clear and Coherent  Affect:  Full range  Mood:  Anxious, pleasant  Thought process:  normal  Thought content:    WNL  Sensory/Perceptual disturbances:    WNL  Orientation:  oriented to person, place, time/date and situation  Attention:  Good  Concentration:  Good  Memory:  WNL  Fund of knowledge:   Good  Insight:    Good  Judgment:   Good  Impulse Control:  Good   Reported Symptoms:  Panic attacks, anxiety most days, some lower motivation (improving), chest pressure  Risk Assessment: Danger to Self:  No Self-injurious Behavior: No Danger to Others: No Duty to Warn:no Physical Aggression / Violence:No  Access to Firearms a concern: No  Gang Involvement:No  Patient / guardian was educated about steps to take if suicide or homicide risk level increases between visits: no While future psychiatric events cannot be accurately predicted, the patient does not currently require acute inpatient psychiatric care and does not currently meet [facility] involuntary commitment criteria.  Medications: Current Outpatient Medications  Medication Sig Dispense Refill  . ALPRAZolam (XANAX) 0.25 MG tablet Take 0.5-1 tablets (0.125-0.25 mg total) by mouth 2 (two) times daily as needed for anxiety. 30 tablet 1  . calcium carbonate (TUMS - DOSED IN MG ELEMENTAL CALCIUM) 500 MG chewable tablet Chew 2 tablets by mouth 2 (two) times daily as needed for heartburn.     . famotidine (PEPCID) 20 MG tablet Take 1 tablet (20 mg total) by mouth 2 (two) times daily. 10 tablet 0  . Melatonin 5 MG CAPS Take by mouth.    . naproxen sodium (ANAPROX) 220 MG tablet Take 440 mg by mouth 2 (two) times daily as needed (for pain).    .  nitroGLYCERIN (NITROSTAT) 0.4 MG SL tablet Place 1 tablet (0.4 mg total) under the tongue every 5 (five) minutes as needed for chest pain. (Patient not taking: Reported on 09/18/2018) 30 tablet 0  . omeprazole (PRILOSEC) 40 MG capsule Take 1 capsule (40 mg total) by mouth daily. (Patient not taking: Reported on 05/22/2018) 30 capsule 3  . oxyCODONE-acetaminophen (PERCOCET) 5-325 MG per tablet Take 2 tablets by mouth every 4 (four) hours as needed for pain. (Patient not taking: Reported on 05/22/2018) 10 tablet 0  . ranitidine (ZANTAC) 75 MG tablet Take 75-150 mg by mouth daily as needed for heartburn.     . sulfamethoxazole-trimethoprim (BACTRIM DS,SEPTRA DS) 800-160 MG tablet Take 1 tablet by mouth 2 (two) times daily. (Patient not taking: Reported on 09/18/2018) 6 tablet 0  . traZODone (DESYREL) 50 MG tablet Take 0.5-1 tablets (25-50 mg total) by mouth at bedtime as needed for sleep. (Patient not taking: Reported on 09/18/2018) 30 tablet 1   No current facility-administered medications for this visit.    Allergies  Allergen Reactions  . Codeine Nausea And Vomiting  . Penicillins Itching and Rash    Diagnoses:    ICD-10-CM   1. Social anxiety disorder  F40.10      Subjective:  Patient presents for session in some mild apparent distress.  He shared how he has felt stressed with work, the continued demands of his job sharing some details throughout the session for clarity.  He shared how his wife also has been coping with stress due to work related issues primarily, how they both are extremely busy and also make efforts to be engaging with her son, assisting him with his academics when needed.  We discussed the potential benefits of engaging in the psychiatric evaluation for potential medication to assist with his described pervasive daily anxiety which is coupled with some secondary feelings of depression.  We discussed coping skill of diaphragmatic breathing with mindfulness concepts discussing in  detail with patient to utilize between sessions.  He verbalized understanding and plans to utilize.  Interventions: CBT, supportive therapy, problem solving  Individualized Plan of Care:  1. Patient to engage psychiatric evaluation and follow medication regimen.  2. Patient to engage in individual psychotherapy.  3. Patient to identify and apply coping skills learned in session to decrease anxiety / panic.  4. Patient to learn and apply CBT, coping skills and strategies learned in session.  5. Patient to contact this office, go to the local ED or call 911 if a crisis or emergency develops between visits.   Progress: Progressing  [name], [facility]

## 2019-03-31 DIAGNOSIS — M9904 Segmental and somatic dysfunction of sacral region: Secondary | ICD-10-CM | POA: Diagnosis not present

## 2019-03-31 DIAGNOSIS — M5408 Panniculitis affecting regions of neck and back, sacral and sacrococcygeal region: Secondary | ICD-10-CM | POA: Diagnosis not present

## 2019-03-31 DIAGNOSIS — M9903 Segmental and somatic dysfunction of lumbar region: Secondary | ICD-10-CM | POA: Diagnosis not present

## 2019-03-31 DIAGNOSIS — M9902 Segmental and somatic dysfunction of thoracic region: Secondary | ICD-10-CM | POA: Diagnosis not present

## 2019-04-16 ENCOUNTER — Other Ambulatory Visit: Payer: Self-pay

## 2019-04-16 ENCOUNTER — Ambulatory Visit (INDEPENDENT_AMBULATORY_CARE_PROVIDER_SITE_OTHER): Payer: BC Managed Care – PPO | Admitting: Mental Health

## 2019-04-16 DIAGNOSIS — F411 Generalized anxiety disorder: Secondary | ICD-10-CM | POA: Diagnosis not present

## 2019-04-16 DIAGNOSIS — F4010 Social phobia, unspecified: Secondary | ICD-10-CM

## 2019-04-16 NOTE — Progress Notes (Signed)
[facility] Counselor / therapist psychotherapy note  Name: STACE PEACE Date: 04/16/19 MRN: [id number]  DOB: 06/05/1970  PCP: Joycelyn Rua, MD  Time spent: 53 minutes Treatment: individual therapy  Mental Status Exam:   Appearance:   casual  Behavior:  WNL  Motor:  WNL  `  Clear and Coherent  Affect:  Full range  Mood:  Anxious, pleasant  Thought process:  normal  Thought content:    WNL  Sensory/Perceptual disturbances:    WNL  Orientation:  oriented to person, place, time/date and situation  Attention:  Good  Concentration:  Good  Memory:  WNL  Fund of knowledge:   Good  Insight:    Good  Judgment:   Good  Impulse Control:  Good   Reported Symptoms:  Panic attacks, anxiety most days, some lower motivation (improving), chest pressure  Risk Assessment: Danger to Self:  No Self-injurious Behavior: No Danger to Others: No Duty to Warn:no Physical Aggression / Violence:No  Access to Firearms a concern: No  Gang Involvement:No  Patient / guardian was educated about steps to take if suicide or homicide risk level increases between visits: no While future psychiatric events cannot be accurately predicted, the patient does not currently require acute inpatient psychiatric care and does not currently meet [facility] involuntary commitment criteria.  Medications: Current Outpatient Medications  Medication Sig Dispense Refill  . ALPRAZolam (XANAX) 0.25 MG tablet Take 0.5-1 tablets (0.125-0.25 mg total) by mouth 2 (two) times daily as needed for anxiety. 30 tablet 1  . calcium carbonate (TUMS - DOSED IN MG ELEMENTAL CALCIUM) 500 MG chewable tablet Chew 2 tablets by mouth 2 (two) times daily as needed for heartburn.     . famotidine (PEPCID) 20 MG tablet Take 1 tablet (20 mg total) by mouth 2 (two) times daily. 10 tablet 0  . Melatonin 5 MG CAPS Take by mouth.    . naproxen sodium (ANAPROX) 220 MG tablet Take 440 mg by mouth 2 (two) times daily as needed (for pain).    .  nitroGLYCERIN (NITROSTAT) 0.4 MG SL tablet Place 1 tablet (0.4 mg total) under the tongue every 5 (five) minutes as needed for chest pain. (Patient not taking: Reported on 09/18/2018) 30 tablet 0  . omeprazole (PRILOSEC) 40 MG capsule Take 1 capsule (40 mg total) by mouth daily. (Patient not taking: Reported on 05/22/2018) 30 capsule 3  . oxyCODONE-acetaminophen (PERCOCET) 5-325 MG per tablet Take 2 tablets by mouth every 4 (four) hours as needed for pain. (Patient not taking: Reported on 05/22/2018) 10 tablet 0  . ranitidine (ZANTAC) 75 MG tablet Take 75-150 mg by mouth daily as needed for heartburn.     . sulfamethoxazole-trimethoprim (BACTRIM DS,SEPTRA DS) 800-160 MG tablet Take 1 tablet by mouth 2 (two) times daily. (Patient not taking: Reported on 09/18/2018) 6 tablet 0  . traZODone (DESYREL) 50 MG tablet Take 0.5-1 tablets (25-50 mg total) by mouth at bedtime as needed for sleep. (Patient not taking: Reported on 09/18/2018) 30 tablet 1   No current facility-administered medications for this visit.    Allergies  Allergen Reactions  . Codeine Nausea And Vomiting  . Penicillins Itching and Rash    Diagnoses:    ICD-10-CM   1. Social anxiety disorder  F40.10   2. Anxiety state  F41.1      Subjective:  Patient arrived on time for todays session sharing progress related to continued work stress.  He shared in detail his various job responsibilities the expansion of  his work duties and being kept for promotion.  He continues to have some anxiety related to giving leading presentations, recently learning he will have to increase in detail more information that he will need to present relating to some psychological challenges.  He finds these events he is knowledgeable of his job duties with self performance expectation but questions his ability to convey information in these meetings, " I just get my head about having to present, especially having to give more information and PowerPoint" was his  negative cognition relating to his feelings of anxiety.  He was able to identify calming self talk, how he is highly competent and skilled in his job.  We reviewed coping, mindfulness strategies with diaphragmatic breathing providing a resource to utilize encouraging him to practice daily.  Interventions: CBT, supportive therapy, problem solving  Individualized Plan of Care:  1. Patient to engage psychiatric evaluation and follow medication regimen.  2. Patient to engage in individual psychotherapy.  3. Patient to identify and apply coping skills learned in session to decrease anxiety / panic.  4. Patient to learn and apply CBT, coping skills and strategies learned in session.  5. Patient to contact this office, go to the local ED or call 911 if a crisis or emergency develops between visits.   Progress: Progressing  Anson [place], [facility]

## 2019-05-10 DIAGNOSIS — M9903 Segmental and somatic dysfunction of lumbar region: Secondary | ICD-10-CM | POA: Diagnosis not present

## 2019-05-10 DIAGNOSIS — M5408 Panniculitis affecting regions of neck and back, sacral and sacrococcygeal region: Secondary | ICD-10-CM | POA: Diagnosis not present

## 2019-05-10 DIAGNOSIS — M9904 Segmental and somatic dysfunction of sacral region: Secondary | ICD-10-CM | POA: Diagnosis not present

## 2019-05-10 DIAGNOSIS — M9902 Segmental and somatic dysfunction of thoracic region: Secondary | ICD-10-CM | POA: Diagnosis not present

## 2019-05-14 ENCOUNTER — Ambulatory Visit (INDEPENDENT_AMBULATORY_CARE_PROVIDER_SITE_OTHER): Payer: BC Managed Care – PPO | Admitting: Mental Health

## 2019-05-14 DIAGNOSIS — F4010 Social phobia, unspecified: Secondary | ICD-10-CM | POA: Diagnosis not present

## 2019-05-14 DIAGNOSIS — F902 Attention-deficit hyperactivity disorder, combined type: Secondary | ICD-10-CM | POA: Diagnosis not present

## 2019-05-14 NOTE — Progress Notes (Signed)
[facility] Counselor / therapist psychotherapy note  Name: EBUBECHUKWU JEDLICKA Date: 05/14/19 MRN: [id number]  DOB: 1970-04-17  PCP: Orpah Melter, MD  Time spent: 57 minutes Treatment: individual therapy  Mental Status Exam:   Appearance:   casual  Behavior:  WNL  Motor:  WNL  `  Clear and Coherent  Affect:  Full range  Mood:  Anxious, pleasant  Thought process:  normal  Thought content:    WNL  Sensory/Perceptual disturbances:    WNL  Orientation:  oriented to person, place, time/date and situation  Attention:  Good  Concentration:  Good  Memory:  WNL  Fund of knowledge:   Good  Insight:    Good  Judgment:   Good  Impulse Control:  Good   Reported Symptoms:  Panic attacks, anxiety most days, some lower motivation (improving), chest pressure  Risk Assessment: Danger to Self:  No Self-injurious Behavior: No Danger to Others: No Duty to Warn:no Physical Aggression / Violence:No  Access to Firearms a concern: No  Gang Involvement:No  Patient / guardian was educated about steps to take if suicide or homicide risk level increases between visits: no While future psychiatric events cannot be accurately predicted, the patient does not currently require acute inpatient psychiatric care and does not currently meet [facility] involuntary commitment criteria.  Medications: Current Outpatient Medications  Medication Sig Dispense Refill  . ALPRAZolam (XANAX) 0.25 MG tablet Take 0.5-1 tablets (0.125-0.25 mg total) by mouth 2 (two) times daily as needed for anxiety. 30 tablet 1  . calcium carbonate (TUMS - DOSED IN MG ELEMENTAL CALCIUM) 500 MG chewable tablet Chew 2 tablets by mouth 2 (two) times daily as needed for heartburn.     . famotidine (PEPCID) 20 MG tablet Take 1 tablet (20 mg total) by mouth 2 (two) times daily. 10 tablet 0  . Melatonin 5 MG CAPS Take by mouth.    . naproxen sodium (ANAPROX) 220 MG tablet Take 440 mg by mouth 2 (two) times daily as needed (for pain).    .  nitroGLYCERIN (NITROSTAT) 0.4 MG SL tablet Place 1 tablet (0.4 mg total) under the tongue every 5 (five) minutes as needed for chest pain. (Patient not taking: Reported on 09/18/2018) 30 tablet 0  . omeprazole (PRILOSEC) 40 MG capsule Take 1 capsule (40 mg total) by mouth daily. (Patient not taking: Reported on 05/22/2018) 30 capsule 3  . oxyCODONE-acetaminophen (PERCOCET) 5-325 MG per tablet Take 2 tablets by mouth every 4 (four) hours as needed for pain. (Patient not taking: Reported on 05/22/2018) 10 tablet 0  . ranitidine (ZANTAC) 75 MG tablet Take 75-150 mg by mouth daily as needed for heartburn.     . sulfamethoxazole-trimethoprim (BACTRIM DS,SEPTRA DS) 800-160 MG tablet Take 1 tablet by mouth 2 (two) times daily. (Patient not taking: Reported on 09/18/2018) 6 tablet 0  . traZODone (DESYREL) 50 MG tablet Take 0.5-1 tablets (25-50 mg total) by mouth at bedtime as needed for sleep. (Patient not taking: Reported on 09/18/2018) 30 tablet 1   No current facility-administered medications for this visit.    Allergies  Allergen Reactions  . Codeine Nausea And Vomiting  . Penicillins Itching and Rash    Diagnoses:  No diagnosis found.   Subjective:  Patient arrived on time for todays session sharing progress related to continued work stress.  He shared in detail his various job responsibilities the expansion of his work duties and being kept for promotion.  He continues to have some anxiety related to giving  leading presentations, recently learning he will have to increase in detail more information that he will need to present relating to some psychological challenges.  He finds these events he is knowledgeable of his job duties with self performance expectation but questions his ability to convey information in these meetings, " I just get my head about having to present, especially having to give more information and PowerPoint" was his negative cognition relating to his feelings of anxiety.  He was  able to identify calming self talk, how he is highly competent and skilled in his job.  We reviewed coping, mindfulness strategies with diaphragmatic breathing providing a resource to utilize encouraging him to practice daily.  Interventions: CBT, supportive therapy, problem solving  Individualized Plan of Care:  1. Patient to engage psychiatric evaluation and follow medication regimen.  2. Patient to engage in individual psychotherapy.  3. Patient to identify and apply coping skills learned in session to decrease anxiety / panic.  4. Patient to learn and apply CBT, coping skills and strategies learned in session.  5. Patient to contact this office, go to the local ED or call 911 if a crisis or emergency develops between visits.   Progress: Progressing  [name], [facility]

## 2019-06-11 ENCOUNTER — Ambulatory Visit: Payer: BC Managed Care – PPO | Admitting: Mental Health

## 2019-06-16 ENCOUNTER — Other Ambulatory Visit: Payer: Self-pay

## 2019-06-16 ENCOUNTER — Ambulatory Visit (INDEPENDENT_AMBULATORY_CARE_PROVIDER_SITE_OTHER): Payer: BC Managed Care – PPO | Admitting: Mental Health

## 2019-06-16 DIAGNOSIS — F902 Attention-deficit hyperactivity disorder, combined type: Secondary | ICD-10-CM | POA: Diagnosis not present

## 2019-06-16 DIAGNOSIS — F4010 Social phobia, unspecified: Secondary | ICD-10-CM | POA: Diagnosis not present

## 2019-06-16 NOTE — Progress Notes (Signed)
[facility] Counselor / therapist psychotherapy note  Name: Troy Mercer Date: 06/16/19 MRN: [id number]  DOB: 09-17-1970  PCP: Orpah Melter, MD  Time spent: 53 minutes Treatment: individual therapy  Mental Status Exam:   Appearance:   casual  Behavior:  WNL  Motor:  WNL  `  Clear and Coherent  Affect:  Full range  Mood:  Anxious, pleasant  Thought process:  normal  Thought content:    WNL  Sensory/Perceptual disturbances:    WNL  Orientation:  oriented to person, place, time/date and situation  Attention:  Good  Concentration:  Good  Memory:  WNL  Fund of knowledge:   Good  Insight:    Good  Judgment:   Good  Impulse Control:  Good   Reported Symptoms:  Panic attacks, anxiety most days, some lower motivation (improving), chest pressure  Risk Assessment: Danger to Self:  No Self-injurious Behavior: No Danger to Others: No Duty to Warn:no Physical Aggression / Violence:No  Access to Firearms a concern: No  Gang Involvement:No  Patient / guardian was educated about steps to take if suicide or homicide risk level increases between visits: no While future psychiatric events cannot be accurately predicted, the patient does not currently require acute inpatient psychiatric care and does not currently meet [facility] involuntary commitment criteria.  Medications: Current Outpatient Medications  Medication Sig Dispense Refill  . ALPRAZolam (XANAX) 0.25 MG tablet Take 0.5-1 tablets (0.125-0.25 mg total) by mouth 2 (two) times daily as needed for anxiety. 30 tablet 1  . calcium carbonate (TUMS - DOSED IN MG ELEMENTAL CALCIUM) 500 MG chewable tablet Chew 2 tablets by mouth 2 (two) times daily as needed for heartburn.     . famotidine (PEPCID) 20 MG tablet Take 1 tablet (20 mg total) by mouth 2 (two) times daily. 10 tablet 0  . Melatonin 5 MG CAPS Take by mouth.    . naproxen sodium (ANAPROX) 220 MG tablet Take 440 mg by mouth 2 (two) times daily as needed (for pain).    .  nitroGLYCERIN (NITROSTAT) 0.4 MG SL tablet Place 1 tablet (0.4 mg total) under the tongue every 5 (five) minutes as needed for chest pain. (Patient not taking: Reported on 09/18/2018) 30 tablet 0  . omeprazole (PRILOSEC) 40 MG capsule Take 1 capsule (40 mg total) by mouth daily. (Patient not taking: Reported on 05/22/2018) 30 capsule 3  . oxyCODONE-acetaminophen (PERCOCET) 5-325 MG per tablet Take 2 tablets by mouth every 4 (four) hours as needed for pain. (Patient not taking: Reported on 05/22/2018) 10 tablet 0  . ranitidine (ZANTAC) 75 MG tablet Take 75-150 mg by mouth daily as needed for heartburn.     . sulfamethoxazole-trimethoprim (BACTRIM DS,SEPTRA DS) 800-160 MG tablet Take 1 tablet by mouth 2 (two) times daily. (Patient not taking: Reported on 09/18/2018) 6 tablet 0  . traZODone (DESYREL) 50 MG tablet Take 0.5-1 tablets (25-50 mg total) by mouth at bedtime as needed for sleep. (Patient not taking: Reported on 09/18/2018) 30 tablet 1   No current facility-administered medications for this visit.    Allergies  Allergen Reactions  . Codeine Nausea And Vomiting  . Penicillins Itching and Rash    Diagnoses:    ICD-10-CM   1. Social anxiety disorder  F40.10   2. Attention deficit hyperactivity disorder (ADHD), combined type  F90.2      Subjective:  Patient presents for session 1 time.  He shared progress, recent events.  He stated that his son is engaged in  some therapy with Stevphen Meuse, [facility] and how he feels it has been beneficial thus far.  Ways he and his wife attempt to be supportive of his interests, spending quality time as well as establishing natural logical consequences with him was shared.  He identified significant stress at work, going on to provide significant detail of recent experiences.  At this point, he identified needing a change in employment due to significant high levels of stress that has caused over the last few months.  He shared how he had an informal job offer  recently and plans to carefully consider this option.  Due to his demanding work schedule currently, he has little time for self-care, often working into the evenings late.  He has had an impact on his relationships and he identified today needing to make this change.  Plan is for him to follow through in communicating with [place] other potential potential job lead.  He appears more hopeful about his situation and expressed readiness to take needed steps.  Interventions: CBT, supportive therapy, problem solving  Individualized Plan of Care:  1. Patient to engage psychiatric evaluation and follow medication regimen.  2. Patient to engage in individual psychotherapy.  3. Patient to identify and apply coping skills learned in session to decrease anxiety / panic.  4. Patient to learn and apply CBT, coping skills and strategies learned in session.  5. Patient to contact this office, go to the local ED or call 911 if a crisis or emergency develops between visits.   Progress: Progressing  [name], [facility]

## 2019-06-18 ENCOUNTER — Ambulatory Visit: Payer: BC Managed Care – PPO | Admitting: Mental Health

## 2019-06-23 DIAGNOSIS — M9904 Segmental and somatic dysfunction of sacral region: Secondary | ICD-10-CM | POA: Diagnosis not present

## 2019-06-23 DIAGNOSIS — M9902 Segmental and somatic dysfunction of thoracic region: Secondary | ICD-10-CM | POA: Diagnosis not present

## 2019-06-23 DIAGNOSIS — M5408 Panniculitis affecting regions of neck and back, sacral and sacrococcygeal region: Secondary | ICD-10-CM | POA: Diagnosis not present

## 2019-06-23 DIAGNOSIS — M9903 Segmental and somatic dysfunction of lumbar region: Secondary | ICD-10-CM | POA: Diagnosis not present

## 2019-07-19 ENCOUNTER — Ambulatory Visit: Payer: BC Managed Care – PPO | Admitting: Mental Health

## 2019-08-11 DIAGNOSIS — M9903 Segmental and somatic dysfunction of lumbar region: Secondary | ICD-10-CM | POA: Diagnosis not present

## 2019-08-11 DIAGNOSIS — M9902 Segmental and somatic dysfunction of thoracic region: Secondary | ICD-10-CM | POA: Diagnosis not present

## 2019-08-11 DIAGNOSIS — M9904 Segmental and somatic dysfunction of sacral region: Secondary | ICD-10-CM | POA: Diagnosis not present

## 2019-08-11 DIAGNOSIS — M5408 Panniculitis affecting regions of neck and back, sacral and sacrococcygeal region: Secondary | ICD-10-CM | POA: Diagnosis not present

## 2019-08-23 ENCOUNTER — Other Ambulatory Visit: Payer: Self-pay | Admitting: Physician Assistant

## 2019-08-23 NOTE — Telephone Encounter (Signed)
Next apt 09/17 °

## 2019-08-30 ENCOUNTER — Ambulatory Visit (INDEPENDENT_AMBULATORY_CARE_PROVIDER_SITE_OTHER): Payer: BC Managed Care – PPO | Admitting: Mental Health

## 2019-08-30 ENCOUNTER — Other Ambulatory Visit: Payer: Self-pay

## 2019-08-30 DIAGNOSIS — F4010 Social phobia, unspecified: Secondary | ICD-10-CM | POA: Diagnosis not present

## 2019-08-30 NOTE — Progress Notes (Signed)
[facility] Counselor / therapist psychotherapy note  Name: RAMELLO CORDIAL Date:  08/30/19 MRN: [id number]  DOB: 1970/07/28  PCP: Joycelyn Rua, MD  Time spent: 55 minutes  Treatment: individual therapy  Mental Status Exam:   Appearance:   casual  Behavior:  WNL  Motor:  WNL  `  Clear and Coherent  Affect:  Full range  Mood:  Anxious, pleasant  Thought process:  normal  Thought content:    WNL  Sensory/Perceptual disturbances:    WNL  Orientation:  oriented to person, place, time/date and situation  Attention:  Good  Concentration:  Good  Memory:  WNL  Fund of knowledge:   Good  Insight:    Good  Judgment:   Good  Impulse Control:  Good   Reported Symptoms:  Panic attacks, anxiety most days, some lower motivation (improving), chest pressure  Risk Assessment: Danger to Self:  No Self-injurious Behavior: No Danger to Others: No Duty to Warn:no Physical Aggression / Violence:No  Access to Firearms a concern: No  Gang Involvement:No  Patient / guardian was educated about steps to take if suicide or homicide risk level increases between visits: no While future psychiatric events cannot be accurately predicted, the patient does not currently require acute inpatient psychiatric care and does not currently meet [facility] involuntary commitment criteria.  Medications: Current Outpatient Medications  Medication Sig Dispense Refill  . ALPRAZolam (XANAX) 0.25 MG tablet TAKE 1 TO 2 TABLETS BY MOUTH AS DIRECTED AS NEEDED FOR ANXIETY, MAY REPEAT ONCE DAILY 30 tablet 0  . calcium carbonate (TUMS - DOSED IN MG ELEMENTAL CALCIUM) 500 MG chewable tablet Chew 2 tablets by mouth 2 (two) times daily as needed for heartburn.     . famotidine (PEPCID) 20 MG tablet Take 1 tablet (20 mg total) by mouth 2 (two) times daily. 10 tablet 0  . Melatonin 5 MG CAPS Take by mouth.    . naproxen sodium (ANAPROX) 220 MG tablet Take 440 mg by mouth 2 (two) times daily as needed (for pain).    .  nitroGLYCERIN (NITROSTAT) 0.4 MG SL tablet Place 1 tablet (0.4 mg total) under the tongue every 5 (five) minutes as needed for chest pain. (Patient not taking: Reported on 09/18/2018) 30 tablet 0  . omeprazole (PRILOSEC) 40 MG capsule Take 1 capsule (40 mg total) by mouth daily. (Patient not taking: Reported on 05/22/2018) 30 capsule 3  . oxyCODONE-acetaminophen (PERCOCET) 5-325 MG per tablet Take 2 tablets by mouth every 4 (four) hours as needed for pain. (Patient not taking: Reported on 05/22/2018) 10 tablet 0  . ranitidine (ZANTAC) 75 MG tablet Take 75-150 mg by mouth daily as needed for heartburn.     . sulfamethoxazole-trimethoprim (BACTRIM DS,SEPTRA DS) 800-160 MG tablet Take 1 tablet by mouth 2 (two) times daily. (Patient not taking: Reported on 09/18/2018) 6 tablet 0  . traZODone (DESYREL) 50 MG tablet Take 0.5-1 tablets (25-50 mg total) by mouth at bedtime as needed for sleep. (Patient not taking: Reported on 09/18/2018) 30 tablet 1   No current facility-administered medications for this visit.    Allergies  Allergen Reactions  . Codeine Nausea And Vomiting  . Penicillins Itching and Rash    Diagnoses:  No diagnosis found.   Subjective:  Patient presents for session in no distress.  He shared positive changes where he obtained a new job and his daily stress is decreased considerably.  He stated that he is working full-time but not excessive hours or into the late  evening.  He went on to share how this is been a positive change for his relationships as he has more time with his son and more time to be helpful with household tasks as his wife continues to work full-time with excessive hours.  He stated his son is returning to school and he hopes this will continue as he feels he needs the structure of going to school on site versus on line which occurred all last year due to the pandemic.  Reports continuing to cope with some anxiety when having to give report for his department at work during  [employer] but less so due to the nature of his current position.  He feels that he is able to get his work done daily and not feel overwhelmed as he did in his last job.  He identified continued areas of needed change.  He plans to start working out again as this is one of his primary outlets for stress.  Interventions: CBT, supportive therapy, problem solving  Individualized Plan of Care:  1. Patient to engage psychiatric evaluation and follow medication regimen.  2. Patient to engage in individual psychotherapy.  3. Patient to identify and apply coping skills learned in session to decrease anxiety / panic.  4. Patient to learn and apply CBT, coping skills and strategies learned in session.  5. Patient to contact this office, go to the local ED or call 911 if a crisis or emergency develops between visits.   Progress: Progressing  [name], [facility]

## 2019-09-22 DIAGNOSIS — M9902 Segmental and somatic dysfunction of thoracic region: Secondary | ICD-10-CM | POA: Diagnosis not present

## 2019-09-22 DIAGNOSIS — M9903 Segmental and somatic dysfunction of lumbar region: Secondary | ICD-10-CM | POA: Diagnosis not present

## 2019-09-22 DIAGNOSIS — M5408 Panniculitis affecting regions of neck and back, sacral and sacrococcygeal region: Secondary | ICD-10-CM | POA: Diagnosis not present

## 2019-09-22 DIAGNOSIS — M9904 Segmental and somatic dysfunction of sacral region: Secondary | ICD-10-CM | POA: Diagnosis not present

## 2019-09-24 ENCOUNTER — Encounter: Payer: Self-pay | Admitting: Physician Assistant

## 2019-09-24 ENCOUNTER — Other Ambulatory Visit: Payer: Self-pay

## 2019-09-24 ENCOUNTER — Ambulatory Visit (INDEPENDENT_AMBULATORY_CARE_PROVIDER_SITE_OTHER): Payer: BC Managed Care – PPO | Admitting: Physician Assistant

## 2019-09-24 DIAGNOSIS — F902 Attention-deficit hyperactivity disorder, combined type: Secondary | ICD-10-CM | POA: Diagnosis not present

## 2019-09-24 DIAGNOSIS — F4010 Social phobia, unspecified: Secondary | ICD-10-CM

## 2019-09-24 DIAGNOSIS — G4700 Insomnia, unspecified: Secondary | ICD-10-CM

## 2019-09-24 MED ORDER — ALPRAZOLAM 0.25 MG PO TABS
ORAL_TABLET | ORAL | 2 refills | Status: DC
Start: 2019-09-24 — End: 2020-03-24

## 2019-09-24 NOTE — Progress Notes (Signed)
[facility]  Patient ID: RAVIS HERNE,  MRN: [id number]  PCP: Joycelyn Rua, MD  Date of Evaluation: 09/24/2019 Time spent:20 minutes  Chief Complaint:  Chief Complaint    Follow-up; Anxiety      HISTORY/CURRENT STATUS: HPI For routine 6 month med check.  Doing well. Changed jobs in July which has really helped with stress.  He is enjoying spending more time with his family.  At his old job, he worked way too many hours a day and was exhausted all the time.  Now he does have more time to spend with family and friends.  Not taking the Xanax very often at all but is definitely helpful.  He continues to see Elio Forget, [facility] for counseling and that is also been very helpful.  He is able to enjoy things.  Denies decreased energy or motivation.   No extreme sadness, tearfulness, or feelings of hopelessness.  Denies any changes in concentration, making decisions or remembering things.  He does have trouble sleeping if he does not take melatonin.  With the melatonin though he can get a full night sleep and feels rested the next morning.  Denies suicidal or homicidal thoughts.  Denies dizziness, syncope, seizures, numbness, tingling, tremor, tics, unsteady gait, slurred speech, confusion. Denies muscle or joint pain, stiffness, or dystonia.  Individual Medical History/ Review of Systems: Changes? :No    Past medications for mental health diagnoses include: Zoloft caused insomnia  Allergies: Codeine and Penicillins  Current Medications:  Current Outpatient Medications:  .  ALPRAZolam (XANAX) 0.25 MG tablet, 1/2-2 po qd prn., Disp: 30 tablet, Rfl: 2 .  calcium carbonate (TUMS - DOSED IN MG ELEMENTAL CALCIUM) 500 MG chewable tablet, Chew 2 tablets by mouth 2 (two) times daily as needed for heartburn. , Disp: , Rfl:  .  famotidine (PEPCID) 20 MG tablet, Take 1 tablet (20 mg total) by mouth 2 (two) times daily., Disp: 10 tablet, Rfl: 0 .  Melatonin 5 MG CAPS, Take by  mouth., Disp: , Rfl:  .  naproxen sodium (ANAPROX) 220 MG tablet, Take 440 mg by mouth 2 (two) times daily as needed (for pain)., Disp: , Rfl:  Medication Side Effects: none  Family Medical/ Social History: Changes? Changed jobs [employer], in July. 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    MENTAL HEALTH EXAM:  There were no vitals taken for this visit.There is no height or weight on file to calculate BMI.  General Appearance: Casual, Neat and Well Groomed  Eye Contact:  Good  Speech:  Clear and Coherent and Normal Rate  Volume:  Normal  Mood:  Euthymic  Affect:  Appropriate  Thought Process:  Goal Directed and Descriptions of Associations: Intact  Orientation:  Full (Time, Place, and Person)  Thought Content: Logical   Suicidal Thoughts:  No  Homicidal Thoughts:  No  Memory:  WNL  Judgement:  Good  Insight:  Good  Psychomotor Activity:  Normal  Concentration:  Concentration: Good and Attention Span: Good  Recall:  Good  Fund of Knowledge: Good  Language: Good  Assets:  Desire for Improvement  ADL's:  Intact  Cognition: WNL  Prognosis:  Good    DIAGNOSES:    ICD-10-CM   1. Social anxiety disorder  F40.10   2. Attention deficit hyperactivity disorder (ADHD), combined type  F90.2   3. Insomnia, unspecified type  G47.00     Receiving Psychotherapy: Yes Elio Forget, [facility]   RECOMMENDATIONS:  PDMP reviewed. I provided  20 mins of face-to-face time during this encounter. Sleep hygiene discussed including the use of amber-colored, blue blocking glasses. Continue Xanax 0.25 mg, 1/2 -1 po bid prn.   Continue melatonin 5 mg nightly as needed. Cont therapy with Elio Forget, [facility] Return in 6 months.  Melony Overly, PA-C

## 2019-10-11 ENCOUNTER — Ambulatory Visit: Payer: Self-pay | Admitting: Mental Health

## 2019-11-01 ENCOUNTER — Other Ambulatory Visit: Payer: Self-pay

## 2019-11-01 ENCOUNTER — Ambulatory Visit (INDEPENDENT_AMBULATORY_CARE_PROVIDER_SITE_OTHER): Payer: BC Managed Care – PPO | Admitting: Mental Health

## 2019-11-01 DIAGNOSIS — F902 Attention-deficit hyperactivity disorder, combined type: Secondary | ICD-10-CM

## 2019-11-01 DIAGNOSIS — F4010 Social phobia, unspecified: Secondary | ICD-10-CM

## 2019-11-01 NOTE — Progress Notes (Signed)
[facility] Counselor / therapist psychotherapy note  Name: Troy Mercer Date:  11/01/19 MRN: [id number]  DOB: [birth year]  PCP: Joycelyn Rua, MD  Time spent: 55 minutes  Treatment: individual therapy  Mental Status Exam:   Appearance:   casual  Behavior:  WNL  Motor:  WNL  `  Clear and Coherent  Affect:  Full range  Mood:  Anxious, pleasant  Thought process:  normal  Thought content:    WNL  Sensory/Perceptual disturbances:    WNL  Orientation:  oriented to person, place, time/date and situation  Attention:  Good  Concentration:  Good  Memory:  WNL  Fund of knowledge:   Good  Insight:    Good  Judgment:   Good  Impulse Control:  Good   Reported Symptoms:  Panic attacks, anxiety most days, some lower motivation (improving), chest pressure  Risk Assessment: Danger to Self:  No Self-injurious Behavior: No Danger to Others: No Duty to Warn:no Physical Aggression / Violence:No  Access to Firearms a concern: No  Gang Involvement:No  Patient / guardian was educated about steps to take if suicide or homicide risk level increases between visits: no While future psychiatric events cannot be accurately predicted, the patient does not currently require acute inpatient psychiatric care and does not currently meet [facility] involuntary commitment criteria.  Medications: Current Outpatient Medications  Medication Sig Dispense Refill  . ALPRAZolam (XANAX) 0.25 MG tablet 1/2-2 po qd prn. 30 tablet 2  . calcium carbonate (TUMS - DOSED IN MG ELEMENTAL CALCIUM) 500 MG chewable tablet Chew 2 tablets by mouth 2 (two) times daily as needed for heartburn.     . famotidine (PEPCID) 20 MG tablet Take 1 tablet (20 mg total) by mouth 2 (two) times daily. 10 tablet 0  . Melatonin 5 MG CAPS Take by mouth.    . naproxen sodium (ANAPROX) 220 MG tablet Take 440 mg by mouth 2 (two) times daily as needed (for pain).     No current facility-administered medications for this visit.     Allergies  Allergen Reactions  . Codeine Nausea And Vomiting  . Penicillins Itching and Rash    Diagnoses:  No diagnosis found.   Subjective:  Patient presents for session sharing progress.  He continues to adjust well to his new job.  He stated it continues to be a considerable amount of less stress than the previous one.  He continues to experience some performance anxiety prior to meetings where he has to present.  He stated that he only has 1 remaining with which he is to present for about 10 minutes once per week at this current job.  Assisted him in identifying and processing feelings related where he identified the negative cognition "what if I forget something" referring to the material with which she will have to cover.  He stated that he creates outlines and notes for areas with which he will have to cover in the meeting.  He shared how he is in anxiety related to talking in front of others for many years.  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 but and work settings seems to be no difference.  Assisted him in identifying calming, realistic talk where he was able to verbalize how he knows he is prepared for these meetings and is often received accolades for his performance.   Interventions: CBT, supportive therapy, problem solving  Individualized Plan of Care:  1.  Patient to engage psychiatric evaluation and follow medication regimen.  2. Patient to engage in individual psychotherapy.  3. Patient to identify and apply coping skills learned in session to decrease anxiety / panic.  4. Patient to learn and apply CBT, coping skills and strategies learned in session.  5. Patient to contact this office, go to the local ED or call 911 if a crisis or emergency develops between visits.   Progress: Progressing  [name], [facility]

## 2019-11-03 DIAGNOSIS — M9904 Segmental and somatic dysfunction of sacral region: Secondary | ICD-10-CM | POA: Diagnosis not present

## 2019-11-03 DIAGNOSIS — M5408 Panniculitis affecting regions of neck and back, sacral and sacrococcygeal region: Secondary | ICD-10-CM | POA: Diagnosis not present

## 2019-11-03 DIAGNOSIS — M9902 Segmental and somatic dysfunction of thoracic region: Secondary | ICD-10-CM | POA: Diagnosis not present

## 2019-11-03 DIAGNOSIS — M9903 Segmental and somatic dysfunction of lumbar region: Secondary | ICD-10-CM | POA: Diagnosis not present

## 2019-12-15 ENCOUNTER — Other Ambulatory Visit: Payer: Self-pay

## 2019-12-15 ENCOUNTER — Ambulatory Visit (INDEPENDENT_AMBULATORY_CARE_PROVIDER_SITE_OTHER): Payer: BC Managed Care – PPO | Admitting: Mental Health

## 2019-12-15 DIAGNOSIS — F4010 Social phobia, unspecified: Secondary | ICD-10-CM | POA: Diagnosis not present

## 2019-12-15 NOTE — Progress Notes (Signed)
[facility] Counselor / therapist psychotherapy note  Name: Troy Mercer Date:  12/15/19 MRN: [id number]  DOB: 27-Sep-1970  PCP: Joycelyn Rua, MD  Time spent: 55 minutes  Treatment: individual therapy  Mental Status Exam:   Appearance:   casual  Behavior:  WNL  Motor:  WNL  `  Clear and Coherent  Affect:  Full range  Mood:  Anxious, pleasant  Thought process:  normal  Thought content:    WNL  Sensory/Perceptual disturbances:    WNL  Orientation:  oriented to person, place, time/date and situation  Attention:  Good  Concentration:  Good  Memory:  WNL  Fund of knowledge:   Good  Insight:    Good  Judgment:   Good  Impulse Control:  Good   Reported Symptoms:  Panic attacks, anxiety most days, some lower motivation (improving), chest pressure  Risk Assessment: Danger to Self:  No Self-injurious Behavior: No Danger to Others: No Duty to Warn:no Physical Aggression / Violence:No  Access to Firearms a concern: No  Gang Involvement:No  Patient / guardian was educated about steps to take if suicide or homicide risk level increases between visits: no While future psychiatric events cannot be accurately predicted, the patient does not currently require acute inpatient psychiatric care and does not currently meet [facility] involuntary commitment criteria.  Medications: Current Outpatient Medications  Medication Sig Dispense Refill  . ALPRAZolam (XANAX) 0.25 MG tablet 1/2-2 po qd prn. 30 tablet 2  . calcium carbonate (TUMS - DOSED IN MG ELEMENTAL CALCIUM) 500 MG chewable tablet Chew 2 tablets by mouth 2 (two) times daily as needed for heartburn.     . famotidine (PEPCID) 20 MG tablet Take 1 tablet (20 mg total) by mouth 2 (two) times daily. 10 tablet 0  . Melatonin 5 MG CAPS Take by mouth.    . naproxen sodium (ANAPROX) 220 MG tablet Take 440 mg by mouth 2 (two) times daily as needed (for pain).     No current facility-administered medications for this visit.     Allergies  Allergen Reactions  . Codeine Nausea And Vomiting  . Penicillins Itching and Rash    Diagnoses:    ICD-10-CM   1. Social anxiety disorder  F40.10      Subjective:  Patient presents for session sharing progress.  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  He stated he continues to have some anxiety with these meetings, however, feels it has been more manageable.  He was able to identify how he has been mindful of his thoughts that elicit more anxiety, reminding himself of "logical self talk", such as "he is asking normal, realistic questions" referring to the CEO.  He shared how other colleagues provide information at this meeting as well.  He continues to thrive at this new job and also continues to share the balance is given to him in his life, more time with his family.  Encouraged him to continue the process of self evaluation via cognitions that elicit more anxiety and also ones with which can be helpful in reducing anxiety levels.   Interventions: CBT, supportive therapy, problem solving  Individualized Plan of Care:  1. Patient to engage psychiatric evaluation and follow medication regimen.  2. Patient to engage in individual psychotherapy.  3. Patient to identify and apply coping skills learned in session to decrease anxiety / panic.  4. Patient to learn and apply CBT,  coping skills and strategies learned in session.  5. Patient to contact this office, go to the local ED or call 911 if a crisis or emergency develops between visits.   Progress: Progressing  [name], [facility]

## 2019-12-22 DIAGNOSIS — M5408 Panniculitis affecting regions of neck and back, sacral and sacrococcygeal region: Secondary | ICD-10-CM | POA: Diagnosis not present

## 2019-12-22 DIAGNOSIS — M9902 Segmental and somatic dysfunction of thoracic region: Secondary | ICD-10-CM | POA: Diagnosis not present

## 2019-12-22 DIAGNOSIS — M9903 Segmental and somatic dysfunction of lumbar region: Secondary | ICD-10-CM | POA: Diagnosis not present

## 2019-12-22 DIAGNOSIS — M9904 Segmental and somatic dysfunction of sacral region: Secondary | ICD-10-CM | POA: Diagnosis not present

## 2020-01-28 ENCOUNTER — Ambulatory Visit: Payer: BC Managed Care – PPO | Admitting: Mental Health

## 2020-02-02 DIAGNOSIS — M5408 Panniculitis affecting regions of neck and back, sacral and sacrococcygeal region: Secondary | ICD-10-CM | POA: Diagnosis not present

## 2020-02-02 DIAGNOSIS — M9903 Segmental and somatic dysfunction of lumbar region: Secondary | ICD-10-CM | POA: Diagnosis not present

## 2020-02-02 DIAGNOSIS — M9902 Segmental and somatic dysfunction of thoracic region: Secondary | ICD-10-CM | POA: Diagnosis not present

## 2020-02-02 DIAGNOSIS — M9904 Segmental and somatic dysfunction of sacral region: Secondary | ICD-10-CM | POA: Diagnosis not present

## 2020-02-04 ENCOUNTER — Other Ambulatory Visit: Payer: Self-pay

## 2020-02-04 ENCOUNTER — Ambulatory Visit (INDEPENDENT_AMBULATORY_CARE_PROVIDER_SITE_OTHER): Payer: BC Managed Care – PPO | Admitting: Mental Health

## 2020-02-04 DIAGNOSIS — F4010 Social phobia, unspecified: Secondary | ICD-10-CM

## 2020-02-04 NOTE — Progress Notes (Signed)
[facility] Counselor / therapist psychotherapy note  Name: Troy Mercer Date:  02/04/20 MRN: [id number]  DOB: December 07, 1970  PCP: Joycelyn Rua, MD  Time spent: 53 minutes  Treatment: individual therapy  Mental Status Exam:   Appearance:   casual  Behavior:  WNL  Motor:  WNL  `  Clear and Coherent  Affect:  Full range  Mood:  Anxious, pleasant  Thought process:  normal  Thought content:    WNL  Sensory/Perceptual disturbances:    WNL  Orientation:  oriented to person, place, time/date and situation  Attention:  Good  Concentration:  Good  Memory:  WNL  Fund of knowledge:   Good  Insight:    Good  Judgment:   Good  Impulse Control:  Good   Reported Symptoms:  Panic attacks, anxiety most days, some lower motivation (improving), chest pressure  Risk Assessment: Danger to Self:  No Self-injurious Behavior: No Danger to Others: No Duty to Warn:no Physical Aggression / Violence:No  Access to Firearms a concern: No  Gang Involvement:No  Patient / guardian was educated about steps to take if suicide or homicide risk level increases between visits: no While future psychiatric events cannot be accurately predicted, the patient does not currently require acute inpatient psychiatric care and does not currently meet [facility] involuntary commitment criteria.  Medications: Current Outpatient Medications  Medication Sig Dispense Refill  . ALPRAZolam (XANAX) 0.25 MG tablet 1/2-2 po qd prn. 30 tablet 2  . calcium carbonate (TUMS - DOSED IN MG ELEMENTAL CALCIUM) 500 MG chewable tablet Chew 2 tablets by mouth 2 (two) times daily as needed for heartburn.     . famotidine (PEPCID) 20 MG tablet Take 1 tablet (20 mg total) by mouth 2 (two) times daily. 10 tablet 0  . Melatonin 5 MG CAPS Take by mouth.    . naproxen sodium (ANAPROX) 220 MG tablet Take 440 mg by mouth 2 (two) times daily as needed (for pain).     No current facility-administered medications for this visit.     Allergies  Allergen Reactions  . Codeine Nausea And Vomiting  . Penicillins Itching and Rash    Diagnoses:    ICD-10-CM   1. Social anxiety disorder  F40.10      Subjective:  Patient presents for session sharing progress.  He shared how he has continued joining his new job which she has had now for the last several months.  Continues to report lower stress, some ongoing performance anxiety prior to meetings and in session identified some calming, realistic self talk as discussed in previous sessions he tries to ground himself with.  We discussed the concept of radical acceptance, where he was able to identify and process recognizing how he does not have to enjoy or get to the point where he has no anxiety at all when presenting in meetings, rather, giving himself some acceptance and understanding around this being challenging for him but not unmanageable.  Interventions: CBT, supportive therapy, problem solving  Individualized Plan of Care:  1. Patient to engage psychiatric evaluation and follow medication regimen.  2. Patient to engage in individual psychotherapy.  3. Patient to identify and apply coping skills learned in session to decrease anxiety / panic.  4. Patient to learn and apply CBT, coping skills and strategies learned in session.  5. Patient to contact this office, go to the local ED or call 911 if a crisis or emergency develops between visits.   Progress: Progressing  [name], [facility]

## 2020-03-03 DIAGNOSIS — M25571 Pain in right ankle and joints of right foot: Secondary | ICD-10-CM | POA: Diagnosis not present

## 2020-03-15 DIAGNOSIS — M5408 Panniculitis affecting regions of neck and back, sacral and sacrococcygeal region: Secondary | ICD-10-CM | POA: Diagnosis not present

## 2020-03-15 DIAGNOSIS — M9904 Segmental and somatic dysfunction of sacral region: Secondary | ICD-10-CM | POA: Diagnosis not present

## 2020-03-15 DIAGNOSIS — M9902 Segmental and somatic dysfunction of thoracic region: Secondary | ICD-10-CM | POA: Diagnosis not present

## 2020-03-15 DIAGNOSIS — M9903 Segmental and somatic dysfunction of lumbar region: Secondary | ICD-10-CM | POA: Diagnosis not present

## 2020-03-16 DIAGNOSIS — G5791 Unspecified mononeuropathy of right lower limb: Secondary | ICD-10-CM | POA: Diagnosis not present

## 2020-03-17 ENCOUNTER — Other Ambulatory Visit: Payer: Self-pay

## 2020-03-17 ENCOUNTER — Ambulatory Visit (INDEPENDENT_AMBULATORY_CARE_PROVIDER_SITE_OTHER): Payer: BC Managed Care – PPO | Admitting: Mental Health

## 2020-03-17 DIAGNOSIS — F4010 Social phobia, unspecified: Secondary | ICD-10-CM

## 2020-03-17 NOTE — Progress Notes (Signed)
[facility] Counselor / therapist psychotherapy note  Name: Troy Mercer Date:  03/17/20 MRN: [id number]  DOB: 11-20-1970  PCP: Troy Rua, MD  Time spent: 53 minutes  Treatment: individual therapy  Mental Status Exam:   Appearance:   casual  Behavior:  WNL  Motor:  WNL  `  Clear and Coherent  Affect:  Full range  Mood:  Anxious, pleasant  Thought process:  normal  Thought content:    WNL  Sensory/Perceptual disturbances:    WNL  Orientation:  oriented to person, place, time/date and situation  Attention:  Good  Concentration:  Good  Memory:  WNL  Fund of knowledge:   Good  Insight:    Good  Judgment:   Good  Impulse Control:  Good   Reported Symptoms:  Panic attacks, anxiety most days, some lower motivation (improving), chest pressure  Risk Assessment: Danger to Self:  No Self-injurious Behavior: No Danger to Others: No Duty to Warn:no Physical Aggression / Violence:No  Access to Firearms a concern: No  Gang Involvement:No  Patient / guardian was educated about steps to take if suicide or homicide risk level increases between visits: no While future psychiatric events cannot be accurately predicted, the patient does not currently require acute inpatient psychiatric care and does not currently meet [facility] involuntary commitment criteria.  Medications: Current Outpatient Medications  Medication Sig Dispense Refill  . ALPRAZolam (XANAX) 0.25 MG tablet 1/2-2 po qd prn. 30 tablet 2  . calcium carbonate (TUMS - DOSED IN MG ELEMENTAL CALCIUM) 500 MG chewable tablet Chew 2 tablets by mouth 2 (two) times daily as needed for heartburn.     . famotidine (PEPCID) 20 MG tablet Take 1 tablet (20 mg total) by mouth 2 (two) times daily. 10 tablet 0  . Melatonin 5 MG CAPS Take by mouth.    . naproxen sodium (ANAPROX) 220 MG tablet Take 440 mg by mouth 2 (two) times daily as needed (for pain).     No current facility-administered medications for this visit.     Allergies  Allergen Reactions  . Codeine Nausea And Vomiting  . Penicillins Itching and Rash    Diagnoses:    ICD-10-CM   1. Social anxiety disorder  F40.10      Subjective:  Patient presents for Mercer sharing progress.  Patient shared how he continues to enjoy his job, some continued performance anxiety at times when having to present in meetings which goes away after he begins the presentation.  We reviewed grounding, calming self talk to utilize prior to his engaging in the presentations which he stated he is followed through with with some success between sessions, sharing that his anxiety has been somewhat lower.   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  Interventions: CBT, supportive therapy, problem solving  Individualized Plan of Care:  1. Patient to engage psychiatric evaluation and follow medication regimen.  2. Patient to engage in individual psychotherapy.  3. Patient to identify and apply coping skills learned in Mercer to decrease anxiety / panic.  4. Patient to learn and apply CBT, coping skills and strategies learned in Mercer.  5. Patient to contact this office, go to the local ED or call 911 if a crisis or emergency develops between visits.   Progress: Progressing  Troy Mercer, Troy Mercer

## 2020-03-24 ENCOUNTER — Encounter: Payer: Self-pay | Admitting: Physician Assistant

## 2020-03-24 ENCOUNTER — Other Ambulatory Visit: Payer: Self-pay

## 2020-03-24 ENCOUNTER — Ambulatory Visit (INDEPENDENT_AMBULATORY_CARE_PROVIDER_SITE_OTHER): Payer: BC Managed Care – PPO | Admitting: Physician Assistant

## 2020-03-24 DIAGNOSIS — G4700 Insomnia, unspecified: Secondary | ICD-10-CM

## 2020-03-24 DIAGNOSIS — F4010 Social phobia, unspecified: Secondary | ICD-10-CM | POA: Diagnosis not present

## 2020-03-24 MED ORDER — ALPRAZOLAM 0.25 MG PO TABS
ORAL_TABLET | ORAL | 2 refills | Status: DC
Start: 2020-03-24 — End: 2020-09-29

## 2020-03-24 NOTE — Progress Notes (Signed)
[facility]  Patient ID: Troy Mercer,  MRN: [id number]  PCP: Joycelyn Rua, MD  Date of Evaluation: 03/24/2020 Time spent:20 minutes  Chief Complaint:  Chief Complaint    Anxiety; Follow-up      HISTORY/CURRENT STATUS: HPI For 6 month med check.   Taking Xanax maybe 1-2 times per week for situational anxiety/social anxiety.  It is still very effective.  He will sometimes get anxious or on edge especially if he needs to be around a lot of people.  Not having full-blown panic attacks just a sense of unease like something bad is going to happen.  Sometimes has racing thoughts when that starts to occur.  Patient denies loss of interest in usual activities and is able to enjoy things.  Denies decreased energy or motivation.  Appetite has not changed.  No extreme sadness, tearfulness, or feelings of hopelessness.  Denies any changes in concentration, making decisions or remembering things.  Sleeps well with the melatonin.  He does not need it every night.  Work is going well.  Denies suicidal or homicidal thoughts.  Denies dizziness, syncope, seizures, numbness, tremor, tics, unsteady gait, slurred speech, confusion. Denies muscle or joint pain, stiffness, or dystonia.  Individual Medical History/ Review of Systems: Changes? :Yes  Minor nerve damage to his foot after snowboarding.  Has a tingling sensation dorsal right foot.  He is seeing a provider for that.  Had steroid pack and now is starting on gabapentin in the next few days.  If that is not helpful he will need an injection in the foot.  Past medications for mental health diagnoses include: Zoloft caused insomnia and made him feel awful.   Allergies: Codeine and Penicillins  Current Medications:  Current Outpatient Medications:  .  calcium carbonate (TUMS - DOSED IN MG ELEMENTAL CALCIUM) 500 MG chewable tablet, Chew 2 tablets by mouth 2 (two) times daily as needed for heartburn. , Disp: , Rfl:  .  famotidine  (PEPCID) 20 MG tablet, Take 1 tablet (20 mg total) by mouth 2 (two) times daily., Disp: 10 tablet, Rfl: 0 .  Melatonin 5 MG CAPS, Take by mouth., Disp: , Rfl:  .  naproxen sodium (ANAPROX) 220 MG tablet, Take 440 mg by mouth 2 (two) times daily as needed (for pain)., Disp: , Rfl:  .  ALPRAZolam (XANAX) 0.25 MG tablet, 1/2-2 po qd prn., Disp: 30 tablet, Rfl: 2 Medication Side Effects: none  Family Medical/ Social History: Changes? No  MENTAL HEALTH EXAM:  There were no vitals taken for this visit.There is no height or weight on file to calculate BMI.  General Appearance: Casual and Well Groomed  Eye Contact:  Good  Speech:  Clear and Coherent and Normal Rate  Volume:  Normal  Mood:  Euthymic  Affect:  Appropriate  Thought Process:  Goal Directed and Descriptions of Associations: Intact  Orientation:  Full (Time, Place, and Person)  Thought Content: Logical   Suicidal Thoughts:  No  Homicidal Thoughts:  No  Memory:  WNL  Judgement:  Good  Insight:  Good  Psychomotor Activity:  Normal  Concentration:  Concentration: Good  Recall:  Good  Fund of Knowledge: Good  Language: Good  Assets:  Desire for Improvement  ADL's:  Intact  Cognition: WNL  Prognosis:  Good    DIAGNOSES:    ICD-10-CM   1. Social anxiety disorder  F40.10   2. Insomnia, unspecified type  G47.00     Receiving Psychotherapy: Yes Elio Forget, [facility].  RECOMMENDATIONS:  PDMP was reviewed. I provided 20 minutes of face-to-face time during this encounter, including time spent before and after the visit reviewing records and charting. He is doing well so no changes in meds are necessary.  He takes the Xanax only a few times a week.  A prescription for 30 pills with 2 refills has lasted for this past 6 months according to PDMP.  There is no reason that we need to add on another med to prevent the anxiety. Continue Xanax 0.25 mg, 1/2-1 p.o. daily as needed anxiety. Continue therapy with Elio Forget,  [facility]. Return in 6 months.  Melony Overly, PA-C

## 2020-04-13 DIAGNOSIS — G5791 Unspecified mononeuropathy of right lower limb: Secondary | ICD-10-CM | POA: Diagnosis not present

## 2020-05-05 ENCOUNTER — Other Ambulatory Visit: Payer: Self-pay

## 2020-05-05 ENCOUNTER — Ambulatory Visit (INDEPENDENT_AMBULATORY_CARE_PROVIDER_SITE_OTHER): Payer: BC Managed Care – PPO | Admitting: Mental Health

## 2020-05-05 DIAGNOSIS — F4010 Social phobia, unspecified: Secondary | ICD-10-CM | POA: Diagnosis not present

## 2020-05-05 NOTE — Progress Notes (Signed)
[facility] Counselor / therapist psychotherapy note  Name: Troy Mercer Date:  05/05/20 MRN: [id number]  DOB: Feb 24, 1970  PCP: Joycelyn Rua, MD  Time spent: 53 minutes  Treatment: individual therapy  Mental Status Exam:   Appearance:   casual  Behavior:  WNL  Motor:  WNL  `  Clear and Coherent  Affect:  Full range  Mood:  Anxious, pleasant  Thought process:  normal  Thought content:    WNL  Sensory/Perceptual disturbances:    WNL  Orientation:  oriented to person, place, time/date and situation  Attention:  Good  Concentration:  Good  Memory:  WNL  Fund of knowledge:   Good  Insight:    Good  Judgment:   Good  Impulse Control:  Good   Reported Symptoms:  Panic attacks, anxiety most days, some lower motivation (improving), chest pressure  Risk Assessment: Danger to Self:  No Self-injurious Behavior: No Danger to Others: No Duty to Warn:no Physical Aggression / Violence:No  Access to Firearms a concern: No  Gang Involvement:No  Patient / guardian was educated about steps to take if suicide or homicide risk level increases between visits: no While future psychiatric events cannot be accurately predicted, the patient does not currently require acute inpatient psychiatric care and does not currently meet [facility] involuntary commitment criteria.  Medications: Current Outpatient Medications  Medication Sig Dispense Refill  . ALPRAZolam (XANAX) 0.25 MG tablet 1/2-2 po qd prn. 30 tablet 2  . calcium carbonate (TUMS - DOSED IN MG ELEMENTAL CALCIUM) 500 MG chewable tablet Chew 2 tablets by mouth 2 (two) times daily as needed for heartburn.     . famotidine (PEPCID) 20 MG tablet Take 1 tablet (20 mg total) by mouth 2 (two) times daily. 10 tablet 0  . Melatonin 5 MG CAPS Take by mouth.    . naproxen sodium (ANAPROX) 220 MG tablet Take 440 mg by mouth 2 (two) times daily as needed (for pain).     No current facility-administered medications for this visit.     Allergies  Allergen Reactions  . Codeine Nausea And Vomiting  . Penicillins Itching and Rash    Diagnoses:    ICD-10-CM   1. Social anxiety disorder  F40.10      Subjective:  Patient presents for session sharing progress.  Patient shared progress, how he continues to excel at his job. He continues to work to manage his performance anxiety prior to meetings which persist at times but decreased since taking this new job particularly per his report. Collaboratively, worked with patient to review and identify thoughts with which he can assist him in anchoring his anxiety and these situations. Time was  spent to allow him to share recent issues that have affected him due to one of his closest friends going through relationship stress and his marriage. Patient shared how it affected him due to their being close and how he is trying to be supportive. Provide support to patient as he process feelings related.   Interventions: CBT, supportive therapy, problem solving  Individualized Plan of Care:  1. Patient to engage psychiatric evaluation and follow medication regimen.  2. Patient to engage in individual psychotherapy.  3. Patient to identify and apply coping skills learned in session to decrease anxiety / panic.  4. Patient to learn and apply CBT, coping skills and strategies learned in session.  5. Patient to contact this office, go to the local ED or call 911 if a crisis or emergency develops between  visits.   Progress: Progressing  [name], [facility]

## 2020-05-10 DIAGNOSIS — M9903 Segmental and somatic dysfunction of lumbar region: Secondary | ICD-10-CM | POA: Diagnosis not present

## 2020-05-10 DIAGNOSIS — M5408 Panniculitis affecting regions of neck and back, sacral and sacrococcygeal region: Secondary | ICD-10-CM | POA: Diagnosis not present

## 2020-05-10 DIAGNOSIS — M9904 Segmental and somatic dysfunction of sacral region: Secondary | ICD-10-CM | POA: Diagnosis not present

## 2020-05-10 DIAGNOSIS — M9902 Segmental and somatic dysfunction of thoracic region: Secondary | ICD-10-CM | POA: Diagnosis not present

## 2020-06-28 DIAGNOSIS — M9902 Segmental and somatic dysfunction of thoracic region: Secondary | ICD-10-CM | POA: Diagnosis not present

## 2020-06-28 DIAGNOSIS — M9903 Segmental and somatic dysfunction of lumbar region: Secondary | ICD-10-CM | POA: Diagnosis not present

## 2020-06-28 DIAGNOSIS — M5408 Panniculitis affecting regions of neck and back, sacral and sacrococcygeal region: Secondary | ICD-10-CM | POA: Diagnosis not present

## 2020-06-28 DIAGNOSIS — M9904 Segmental and somatic dysfunction of sacral region: Secondary | ICD-10-CM | POA: Diagnosis not present

## 2020-06-30 ENCOUNTER — Other Ambulatory Visit: Payer: Self-pay

## 2020-06-30 ENCOUNTER — Ambulatory Visit: Payer: BC Managed Care – PPO | Admitting: Mental Health

## 2020-06-30 DIAGNOSIS — F4010 Social phobia, unspecified: Secondary | ICD-10-CM

## 2020-06-30 NOTE — Progress Notes (Signed)
[facility] Counselor / therapist psychotherapy note  Name: LIN GLAZIER Date:  06/30/20 MRN: [id number]  DOB: [birth year]  PCP: Joycelyn Rua, MD  Time spent: 53 minutes  Treatment: individual therapy  Mental Status Exam:    Appearance:   casual  Behavior:  WNL  Motor:  WNL  `  Clear and Coherent  Affect:  Full range  Mood:  Anxious, pleasant  Thought process:  normal  Thought content:    WNL  Sensory/Perceptual disturbances:    WNL  Orientation:  oriented to person, place, time/date and situation  Attention:  Good  Concentration:  Good  Memory:  WNL  Fund of knowledge:   Good  Insight:    Good  Judgment:   Good  Impulse Control:  Good   Reported Symptoms:  Panic attacks, anxiety most days, some lower motivation (improving), chest pressure  Risk Assessment: Danger to Self:  No Self-injurious Behavior: No Danger to Others: No Duty to Warn:no Physical Aggression / Violence:No  Access to Firearms a concern: No  Gang Involvement:No  Patient / guardian was educated about steps to take if suicide or homicide risk level increases between visits: no While future psychiatric events cannot be accurately predicted, the patient does not currently require acute inpatient psychiatric care and does not currently meet [facility] involuntary commitment criteria.  Medications: Current Outpatient Medications  Medication Sig Dispense Refill   ALPRAZolam (XANAX) 0.25 MG tablet 1/2-2 po qd prn. 30 tablet 2   calcium carbonate (TUMS - DOSED IN MG ELEMENTAL CALCIUM) 500 MG chewable tablet Chew 2 tablets by mouth 2 (two) times daily as needed for heartburn.      famotidine (PEPCID) 20 MG tablet Take 1 tablet (20 mg total) by mouth 2 (two) times daily. 10 tablet 0   Melatonin 5 MG CAPS Take by mouth.     naproxen sodium (ANAPROX) 220 MG tablet Take 440 mg by mouth 2 (two) times daily as needed (for pain).     No current facility-administered medications for this visit.    Allergies   Allergen Reactions   Codeine Nausea And Vomiting   Penicillins Itching and Rash    Diagnoses:    ICD-10-CM   1. Social anxiety disorder  F40.10         Subjective:  Patient presents for session sharing progress.      Patient shared progress, how he continues to excel at his job. He continues to work to manage his performance anxiety prior to meetings which persist at times but decreased since taking this new job particularly per his report. Collaboratively, worked with patient to review and identify thoughts with which he can assist him in anchoring his anxiety and these situations. Time was  spent to allow him to share recent issues that have affected him due to one of his closest friends going through relationship stress and his marriage. Patient shared how it affected him due to their being close and how he is trying to be supportive. Provide support to patient as he process feelings related.   Interventions: CBT, supportive therapy, problem solving  Individualized Plan of Care:  1. Patient to engage psychiatric evaluation and follow medication regimen.  2. Patient to engage in individual psychotherapy.  3. Patient to identify and apply coping skills learned in session to decrease anxiety / panic.  4. Patient to learn and apply CBT, coping skills and strategies learned in session.  5. Patient to contact this office, go to the local ED or call  911 if a crisis or emergency develops between visits.   Progress: Progressing  [name], [facility]

## 2020-07-11 DIAGNOSIS — L821 Other seborrheic keratosis: Secondary | ICD-10-CM | POA: Diagnosis not present

## 2020-07-11 DIAGNOSIS — D229 Melanocytic nevi, unspecified: Secondary | ICD-10-CM | POA: Diagnosis not present

## 2020-07-11 DIAGNOSIS — L814 Other melanin hyperpigmentation: Secondary | ICD-10-CM | POA: Diagnosis not present

## 2020-08-09 DIAGNOSIS — M5408 Panniculitis affecting regions of neck and back, sacral and sacrococcygeal region: Secondary | ICD-10-CM | POA: Diagnosis not present

## 2020-08-09 DIAGNOSIS — M9902 Segmental and somatic dysfunction of thoracic region: Secondary | ICD-10-CM | POA: Diagnosis not present

## 2020-08-09 DIAGNOSIS — M9904 Segmental and somatic dysfunction of sacral region: Secondary | ICD-10-CM | POA: Diagnosis not present

## 2020-08-09 DIAGNOSIS — M9903 Segmental and somatic dysfunction of lumbar region: Secondary | ICD-10-CM | POA: Diagnosis not present

## 2020-09-01 ENCOUNTER — Ambulatory Visit: Payer: BC Managed Care – PPO | Admitting: Mental Health

## 2020-09-01 ENCOUNTER — Other Ambulatory Visit: Payer: Self-pay

## 2020-09-01 DIAGNOSIS — F4010 Social phobia, unspecified: Secondary | ICD-10-CM

## 2020-09-01 NOTE — Progress Notes (Signed)
[facility] Counselor / therapist psychotherapy note  Name: Troy Mercer Date:  09/01/20 MRN: [id number]  DOB: 05-24-1970  PCP: Joycelyn Rua, MD  Time spent: 50 minutes  Treatment: individual therapy  Mental Status Exam:    Appearance:   casual  Behavior:  WNL  Motor:  WNL  `  Clear and Coherent  Affect:  Full range  Mood:  Anxious, pleasant  Thought process:  normal  Thought content:    WNL  Sensory/Perceptual disturbances:    WNL  Orientation:  oriented to person, place, time/date and situation  Attention:  Good  Concentration:  Good  Memory:  WNL  Fund of knowledge:   Good  Insight:    Good  Judgment:   Good  Impulse Control:  Good   Reported Symptoms:  Panic attacks, anxiety most days, some lower motivation (improving), chest pressure  Risk Assessment: Danger to Self:  No Self-injurious Behavior: No Danger to Others: No Duty to Warn:no Physical Aggression / Violence:No  Access to Firearms a concern: No  Gang Involvement:No  Patient / guardian was educated about steps to take if suicide or homicide risk level increases between visits: no While future psychiatric events cannot be accurately predicted, the patient does not currently require acute inpatient psychiatric care and does not currently meet [facility] involuntary commitment criteria.  Medications: Current Outpatient Medications  Medication Sig Dispense Refill   ALPRAZolam (XANAX) 0.25 MG tablet 1/2-2 po qd prn. 30 tablet 2   calcium carbonate (TUMS - DOSED IN MG ELEMENTAL CALCIUM) 500 MG chewable tablet Chew 2 tablets by mouth 2 (two) times daily as needed for heartburn.      famotidine (PEPCID) 20 MG tablet Take 1 tablet (20 mg total) by mouth 2 (two) times daily. 10 tablet 0   Melatonin 5 MG CAPS Take by mouth.     naproxen sodium (ANAPROX) 220 MG tablet Take 440 mg by mouth 2 (two) times daily as needed (for pain).     No current facility-administered medications for this visit.    Allergies   Allergen Reactions   Codeine Nausea And Vomiting   Penicillins Itching and Rash    Diagnoses:    ICD-10-CM   1. Social anxiety disorder  F40.10          Subjective:  Patient presents for session sharing progress.  Patient presents for session. Be sure many pleasant family experiences he's had with family over the summer, how it went quickly and now it's time for his son to begin. He shared concerns, worry about his son, as he lost contact with a friend several months ago. Patient said this was his best friend, how they've known each other since early childhood. You shared ways he tries to be supportive to his son as he continues to feel sad at times about this loss of friendship. He continues to cope with some performance anxiety as discussed in previous sessions. Continued ways to cope for explore collaboratively. He plans to use movement as oppossed to sitting still while waiting to present in his weekly meetings. He feels this will help w/ some thought blocking and we discussed the process in detail to work on between sessions.     Interventions: CBT, supportive therapy, problem solving  Individualized Plan of Care:  1. Patient to engage psychiatric evaluation and follow medication regimen.  2. Patient to engage in individual psychotherapy.  3. Patient to identify and apply coping skills learned in session to decrease anxiety / panic.  4.  Patient to learn and apply CBT, coping skills and strategies learned in session.  5. Patient to contact this office, go to the local ED or call 911 if a crisis or emergency develops between visits.   Progress: Progressing  [name], [facility]

## 2020-09-05 DIAGNOSIS — Z13220 Encounter for screening for lipoid disorders: Secondary | ICD-10-CM | POA: Diagnosis not present

## 2020-09-05 DIAGNOSIS — Z131 Encounter for screening for diabetes mellitus: Secondary | ICD-10-CM | POA: Diagnosis not present

## 2020-09-05 DIAGNOSIS — Z0000 Encounter for general adult medical examination without abnormal findings: Secondary | ICD-10-CM | POA: Diagnosis not present

## 2020-09-27 DIAGNOSIS — M9904 Segmental and somatic dysfunction of sacral region: Secondary | ICD-10-CM | POA: Diagnosis not present

## 2020-09-27 DIAGNOSIS — M5408 Panniculitis affecting regions of neck and back, sacral and sacrococcygeal region: Secondary | ICD-10-CM | POA: Diagnosis not present

## 2020-09-27 DIAGNOSIS — M9902 Segmental and somatic dysfunction of thoracic region: Secondary | ICD-10-CM | POA: Diagnosis not present

## 2020-09-27 DIAGNOSIS — M9903 Segmental and somatic dysfunction of lumbar region: Secondary | ICD-10-CM | POA: Diagnosis not present

## 2020-09-29 ENCOUNTER — Other Ambulatory Visit: Payer: Self-pay

## 2020-09-29 ENCOUNTER — Ambulatory Visit: Payer: BC Managed Care – PPO | Admitting: Physician Assistant

## 2020-09-29 ENCOUNTER — Encounter: Payer: Self-pay | Admitting: Physician Assistant

## 2020-09-29 DIAGNOSIS — G4700 Insomnia, unspecified: Secondary | ICD-10-CM

## 2020-09-29 DIAGNOSIS — F4010 Social phobia, unspecified: Secondary | ICD-10-CM

## 2020-09-29 MED ORDER — ALPRAZOLAM 0.25 MG PO TABS
ORAL_TABLET | ORAL | 2 refills | Status: DC
Start: 2020-09-29 — End: 2021-03-30

## 2020-09-29 NOTE — Progress Notes (Signed)
[facility] Med Check  Patient ID: Troy Mercer,  MRN: [id number]  PCP: Joycelyn Rua, MD  Date of Evaluation: 09/29/2020 Time spent:20 minutes  Chief Complaint:  Chief Complaint   Anxiety; Follow-up      HISTORY/CURRENT STATUS: HPI For 6 month med check.   Doing well. Still has anxiety on occasion when he needs the Xanax.  He might take 1 pill in a week sometimes not that much.  He knows the warning signs of palpitations in his chest will start getting tight.  He will take half of a Xanax and he feels better pretty quickly.  He takes melatonin some nights to help sleep.  Not every night, but there are times when he has intrusive thoughts, trying to plan his next day at work or what ever and he cannot relax and go to sleep unless he has taken the melatonin.  Patient denies loss of interest in usual activities and is able to enjoy things.  Denies decreased energy or motivation.  Appetite has not changed.  No extreme sadness, tearfulness, or feelings of hopelessness.  Denies any changes in concentration, making decisions or remembering things.  Denies suicidal or homicidal thoughts.  Denies dizziness, syncope, seizures, numbness, tingling, tremor, tics, unsteady gait, slurred speech, confusion. Denies muscle or joint pain, stiffness, or dystonia.  Individual Medical History/ Review of Systems: Changes? :No       Past medications for mental health diagnoses include: Zoloft caused insomnia and made him feel awful.   Allergies: Codeine and Penicillins  Current Medications:  Current Outpatient Medications:    calcium carbonate (TUMS - DOSED IN MG ELEMENTAL CALCIUM) 500 MG chewable tablet, Chew 2 tablets by mouth 2 (two) times daily as needed for heartburn. , Disp: , Rfl:    famotidine (PEPCID) 20 MG tablet, Take 1 tablet (20 mg total) by mouth 2 (two) times daily., Disp: 10 tablet, Rfl: 0   Melatonin 5 MG CAPS, Take by mouth., Disp: , Rfl:    Multiple Vitamins-Minerals  (EMERGEN-C IMMUNE PO), Take by mouth., Disp: , Rfl:    naproxen sodium (ANAPROX) 220 MG tablet, Take 440 mg by mouth 2 (two) times daily as needed (for pain)., Disp: , Rfl:    ALPRAZolam (XANAX) 0.25 MG tablet, 1/2-2 po qd prn., Disp: 30 tablet, Rfl: 2 Medication Side Effects: none  Family Medical/ Social History: Changes? No  MENTAL HEALTH EXAM:  There were no vitals taken for this visit.There is no height or weight on file to calculate BMI.  General Appearance: Casual and Well Groomed  Eye Contact:  Good  Speech:  Clear and Coherent and Normal Rate  Volume:  Normal  Mood:  Euthymic  Affect:  Appropriate  Thought Process:  Goal Directed and Descriptions of Associations: Circumstantial  Orientation:  Full (Time, Place, and Person)  Thought Content: Logical   Suicidal Thoughts:  No  Homicidal Thoughts:  No  Memory:  WNL  Judgement:  Good  Insight:  Good  Psychomotor Activity:  Normal  Concentration:  Concentration: Good and Attention Span: Good  Recall:  Good  Fund of Knowledge: Good  Language: Good  Assets:  Desire for Improvement  ADL's:  Intact  Cognition: WNL  Prognosis:  Good    DIAGNOSES:    ICD-10-CM   1. Social anxiety disorder  F40.10     2. Insomnia, unspecified type  G47.00        Receiving Psychotherapy: Yes Elio Forget, [facility].   RECOMMENDATIONS:  PDMP was reviewed.  Xanax last  filled 03/24/2020. I provided 20 minutes of face to face time during this encounter, including time spent before and after the visit in records review, medical decision making, and charting.  Doing well so no changes need to be made.  30 pills have lasted 6 months so there is no reason he should be required to take medication to prevent the anxiety. Continue Xanax 0.25 mg, 1/2-1 p.o. daily as needed anxiety. Continue therapy with Elio Forget, [facility] Return in 6 months.  Melony Overly, PA-C

## 2020-10-27 ENCOUNTER — Ambulatory Visit: Payer: BC Managed Care – PPO | Admitting: Mental Health

## 2020-10-27 ENCOUNTER — Other Ambulatory Visit: Payer: Self-pay

## 2020-10-27 DIAGNOSIS — F4010 Social phobia, unspecified: Secondary | ICD-10-CM | POA: Diagnosis not present

## 2020-10-27 NOTE — Progress Notes (Signed)
[facility] Counselor / therapist psychotherapy note  Name: Troy Mercer Date:  10/27/20 MRN: [id number]  DOB: 18-Apr-1970  PCP: Joycelyn Rua, MD  Time spent: 50 minutes  Treatment: individual therapy  Mental Status Exam:    Appearance:   casual  Behavior:  WNL  Motor:  WNL  `  Clear and Coherent  Affect:  Full range  Mood:  Anxious, pleasant  Thought process:  normal  Thought content:    WNL  Sensory/Perceptual disturbances:    WNL  Orientation:  oriented to person, place, time/date and situation  Attention:  Good  Concentration:  Good  Memory:  WNL  Fund of knowledge:   Good  Insight:    Good  Judgment:   Good  Impulse Control:  Good   Reported Symptoms:  Panic attacks, anxiety most days, some lower motivation (improving), chest pressure  Risk Assessment: Danger to Self:  No Self-injurious Behavior: No Danger to Others: No Duty to Warn:no Physical Aggression / Violence:No  Access to Firearms a concern: No  Gang Involvement:No  Patient / guardian was educated about steps to take if suicide or homicide risk level increases between visits: no While future psychiatric events cannot be accurately predicted, the patient does not currently require acute inpatient psychiatric care and does not currently meet [facility] involuntary commitment criteria.  Medications: Current Outpatient Medications  Medication Sig Dispense Refill   ALPRAZolam (XANAX) 0.25 MG tablet 1/2-2 po qd prn. 30 tablet 2   calcium carbonate (TUMS - DOSED IN MG ELEMENTAL CALCIUM) 500 MG chewable tablet Chew 2 tablets by mouth 2 (two) times daily as needed for heartburn.      famotidine (PEPCID) 20 MG tablet Take 1 tablet (20 mg total) by mouth 2 (two) times daily. 10 tablet 0   Melatonin 5 MG CAPS Take by mouth.     Multiple Vitamins-Minerals (EMERGEN-C IMMUNE PO) Take by mouth.     naproxen sodium (ANAPROX) 220 MG tablet Take 440 mg by mouth 2 (two) times daily as needed (for pain).     No  current facility-administered medications for this visit.    Allergies  Allergen Reactions   Codeine Nausea And Vomiting   Penicillins Itching and Rash    Diagnoses:    ICD-10-CM   1. Social anxiety disorder  F40.10           Subjective:  Patient presents for session sharing progress. Patient shared progress. Continues to report anxiety weekly when participating in business meetings. He said meetings are held weekly and he has followed through was trying to keep his mind occupied as discussed last session. We reviewed thought blocking specifically. You share another changes, his wife recently lost her job but he stated that they will adjust. A report some reduced stress related to his son; relationship stressors with one of his friends.He plans to follow through with using some distraction, thought blocking as discussed in session to manage his anxiety in weekly meetings.   Interventions: CBT, supportive therapy, problem solving  Individualized Plan of Care:  1. Patient to engage psychiatric evaluation and follow medication regimen.  2. Patient to engage in individual psychotherapy.  3. Patient to identify and apply coping skills learned in session to decrease anxiety / panic.  4. Patient to learn and apply CBT, coping skills and strategies learned in session.  5. Patient to contact this office, go to the local ED or call 911 if a crisis or emergency develops between visits.   Progress: Progressing  Anson Oregon, [facility]

## 2020-11-08 DIAGNOSIS — M5408 Panniculitis affecting regions of neck and back, sacral and sacrococcygeal region: Secondary | ICD-10-CM | POA: Diagnosis not present

## 2020-11-08 DIAGNOSIS — M9903 Segmental and somatic dysfunction of lumbar region: Secondary | ICD-10-CM | POA: Diagnosis not present

## 2020-11-08 DIAGNOSIS — M9904 Segmental and somatic dysfunction of sacral region: Secondary | ICD-10-CM | POA: Diagnosis not present

## 2020-11-08 DIAGNOSIS — M9902 Segmental and somatic dysfunction of thoracic region: Secondary | ICD-10-CM | POA: Diagnosis not present

## 2020-12-22 ENCOUNTER — Ambulatory Visit: Payer: BC Managed Care – PPO | Admitting: Mental Health

## 2021-01-17 DIAGNOSIS — M9904 Segmental and somatic dysfunction of sacral region: Secondary | ICD-10-CM | POA: Diagnosis not present

## 2021-01-17 DIAGNOSIS — M9903 Segmental and somatic dysfunction of lumbar region: Secondary | ICD-10-CM | POA: Diagnosis not present

## 2021-01-17 DIAGNOSIS — M9902 Segmental and somatic dysfunction of thoracic region: Secondary | ICD-10-CM | POA: Diagnosis not present

## 2021-01-17 DIAGNOSIS — M5408 Panniculitis affecting regions of neck and back, sacral and sacrococcygeal region: Secondary | ICD-10-CM | POA: Diagnosis not present

## 2021-01-19 ENCOUNTER — Ambulatory Visit (INDEPENDENT_AMBULATORY_CARE_PROVIDER_SITE_OTHER): Payer: BC Managed Care – PPO | Admitting: Mental Health

## 2021-01-19 ENCOUNTER — Other Ambulatory Visit: Payer: Self-pay

## 2021-01-19 DIAGNOSIS — F4010 Social phobia, unspecified: Secondary | ICD-10-CM

## 2021-01-22 NOTE — Progress Notes (Signed)
[facility] Counselor / therapist psychotherapy note  Name: Troy Mercer Date:  01/19/21 MRN: [id number]  DOB: Dec 25, 1970  PCP: Orpah Melter, MD  Time spent: 55 minutes  Treatment: individual therapy  Mental Status Exam:    Appearance:   casual  Behavior:  WNL  Motor:  WNL  `  Clear and Coherent  Affect:  Full range  Mood:  Anxious, pleasant  Thought process:  normal  Thought content:    WNL  Sensory/Perceptual disturbances:    WNL  Orientation:  oriented to person, place, time/date and situation  Attention:  Good  Concentration:  Good  Memory:  WNL  Fund of knowledge:   Good  Insight:    Good  Judgment:   Good  Impulse Control:  Good   Reported Symptoms:  Panic attacks, anxiety most days, some lower motivation (improving), chest pressure  Risk Assessment: Danger to Self:  No Self-injurious Behavior: No Danger to Others: No Duty to Warn:no Physical Aggression / Violence:No  Access to Firearms a concern: No  Gang Involvement:No  Patient / guardian was educated about steps to take if suicide or homicide risk level increases between visits: no While future psychiatric events cannot be accurately predicted, the patient does not currently require acute inpatient psychiatric care and does not currently meet [facility] involuntary commitment criteria.  Medications: Current Outpatient Medications  Medication Sig Dispense Refill   ALPRAZolam (XANAX) 0.25 MG tablet 1/2-2 po qd prn. 30 tablet 2   calcium carbonate (TUMS - DOSED IN MG ELEMENTAL CALCIUM) 500 MG chewable tablet Chew 2 tablets by mouth 2 (two) times daily as needed for heartburn.      famotidine (PEPCID) 20 MG tablet Take 1 tablet (20 mg total) by mouth 2 (two) times daily. 10 tablet 0   Melatonin 5 MG CAPS Take by mouth.     Multiple Vitamins-Minerals (EMERGEN-C IMMUNE PO) Take by mouth.     naproxen sodium (ANAPROX) 220 MG tablet Take 440 mg by mouth 2 (two) times daily as needed (for pain).     No  current facility-administered medications for this visit.    Allergies  Allergen Reactions   Codeine Nausea And Vomiting   Penicillins Itching and Rash    Diagnoses:    ICD-10-CM   1. Social anxiety disorder  F40.10         Subjective:  Patient presents for session on time.  He shared how his wife remains unemployed but received a Customer service manager from her work.  He stated that she has been working full-time primarily for the past several years and he feels that she needs to probably find a job soon as she is not used to being out of work for this period of time.  He stated they are doing okay financially, he is doing well at work and recently was commended for his efforts by his boss and Teacher, English as a foreign language.  He shared how he is diligent with his work, has a strong work ethic however, identified having some difficulty understanding why he was given such praise at times.  He stated that he is "just doing his job, appearing to have some challenges with accepting compliments.  Patient was encouraged to further consider this tendency between visits as he stated it has been recurrent throughout his life in different aspects.  Reports his son is having some recent nightmares and he plans to get him reengage in therapy.  Reports to continue to struggle with anxiety when giving his weekly meeting comments but  was able in session to identify self supportive thoughts, also what others tell him, where he is highly effective speaker and very knowledgeable.  Interventions: CBT, supportive therapy, problem solving  Individualized Plan of Care:  1. Patient to engage psychiatric evaluation and follow medication regimen.  2. Patient to engage in individual psychotherapy.  3. Patient to identify and apply coping skills learned in session to decrease anxiety / panic.  4. Patient to learn and apply CBT, coping skills and strategies learned in session.  5. Patient to contact this office, go to the local ED or call 911  if a crisis or emergency develops between visits.   Progress: Progressing  Anson Oregon, [facility]

## 2021-03-01 DIAGNOSIS — M9902 Segmental and somatic dysfunction of thoracic region: Secondary | ICD-10-CM | POA: Diagnosis not present

## 2021-03-01 DIAGNOSIS — M9904 Segmental and somatic dysfunction of sacral region: Secondary | ICD-10-CM | POA: Diagnosis not present

## 2021-03-01 DIAGNOSIS — M5408 Panniculitis affecting regions of neck and back, sacral and sacrococcygeal region: Secondary | ICD-10-CM | POA: Diagnosis not present

## 2021-03-01 DIAGNOSIS — M9903 Segmental and somatic dysfunction of lumbar region: Secondary | ICD-10-CM | POA: Diagnosis not present

## 2021-03-02 ENCOUNTER — Ambulatory Visit: Payer: BC Managed Care – PPO | Admitting: Mental Health

## 2021-03-02 ENCOUNTER — Other Ambulatory Visit: Payer: Self-pay

## 2021-03-02 DIAGNOSIS — F4010 Social phobia, unspecified: Secondary | ICD-10-CM

## 2021-03-02 NOTE — Progress Notes (Signed)
[facility] Counselor / therapist psychotherapy note  Name: Troy Mercer Date:  03/02/21 MRN: [id number]  DOB: Mar 11, 1970  PCP: Troy Rua, MD  Time spent: 56 minutes  Treatment: individual therapy  Mental Status Exam:    Appearance:   casual  Behavior:  WNL  Motor:  WNL  `  Clear and Coherent  Affect:  Full range  Mood:  Anxious, pleasant  Thought process:  normal  Thought content:    WNL  Sensory/Perceptual disturbances:    WNL  Orientation:  oriented to person, place, time/date and situation  Attention:  Good  Concentration:  Good  Memory:  WNL  Fund of knowledge:   Good  Insight:    Good  Judgment:   Good  Impulse Control:  Good   Reported Symptoms:  Panic attacks, anxiety most days, some lower motivation (improving), chest pressure  Risk Assessment: Danger to Self:  No Self-injurious Behavior: No Danger to Others: No Duty to Warn:no Physical Aggression / Violence:No  Access to Firearms a concern: No  Gang Involvement:No  Patient / guardian was educated about steps to take if suicide or homicide risk level increases between visits: no While future psychiatric events cannot be accurately predicted, the patient does not currently require acute inpatient psychiatric care and does not currently meet [facility] involuntary commitment criteria.  Medications: Current Outpatient Medications  Medication Sig Dispense Refill   ALPRAZolam (XANAX) 0.25 MG tablet 1/2-2 po qd prn. 30 tablet 2   calcium carbonate (TUMS - DOSED IN MG ELEMENTAL CALCIUM) 500 MG chewable tablet Chew 2 tablets by mouth 2 (two) times daily as needed for heartburn.      famotidine (PEPCID) 20 MG tablet Take 1 tablet (20 mg total) by mouth 2 (two) times daily. 10 tablet 0   Melatonin 5 MG CAPS Take by mouth.     Multiple Vitamins-Minerals (EMERGEN-C IMMUNE PO) Take by mouth.     naproxen sodium (ANAPROX) 220 MG tablet Take 440 mg by mouth 2 (two) times daily as needed (for pain).     No  current facility-administered medications for this visit.    Allergies  Allergen Reactions   Codeine Nausea And Vomiting   Penicillins Itching and Rash    Diagnoses:    ICD-10-CM   1. Social anxiety disorder  F40.10         Subjective:  Patient presents for session on time.  Patient shared progress, recent events. He said that his wife continues to seek employment and she was laid off approximately 6 months ago. He expressed some concern as she has a long work history and how he knows that she needs to find employment as this fits with her personality and typical routine. 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 Some thought specifically prior to his giving the presentations, such as his being next in line to provide feedback in the meeting, leads him to feel an immediate rush of anxiety. He was able to identify several cognitions and session related to being able to rationalize how he has nothing to fear, he is well versed and highly knowledgeable of his position and has been told on multiple occasions by his superiors. We reviewed coping skills, such as diaphragmatic breathing with mindfulness and this was reviewed in session today where he plans to integrate into his daily routine more consistently.   Interventions: CBT, supportive therapy,  problem solving  Individualized Plan of Care:  1. Patient to engage psychiatric evaluation and follow medication regimen.  2. Patient to engage in individual psychotherapy.  3. Patient to identify and apply coping skills learned in session to decrease anxiety / panic.  4. Patient to learn and apply CBT, coping skills and strategies learned in session.  5. Patient to contact this office, go to the local ED or call 911 if a crisis or emergency develops between visits.   Progress:  Progressing  [name], [facility]

## 2021-03-30 ENCOUNTER — Encounter: Payer: Self-pay | Admitting: Physician Assistant

## 2021-03-30 ENCOUNTER — Other Ambulatory Visit: Payer: Self-pay

## 2021-03-30 ENCOUNTER — Ambulatory Visit: Payer: BC Managed Care – PPO | Admitting: Physician Assistant

## 2021-03-30 DIAGNOSIS — F4010 Social phobia, unspecified: Secondary | ICD-10-CM

## 2021-03-30 DIAGNOSIS — G4700 Insomnia, unspecified: Secondary | ICD-10-CM | POA: Diagnosis not present

## 2021-03-30 MED ORDER — ALPRAZOLAM 0.25 MG PO TABS: ORAL_TABLET | ORAL | 2 refills | Status: DC

## 2021-03-30 NOTE — Progress Notes (Signed)
[facility] Med Check ? ?Patient ID: Troy Mercer,  ?MRN: [id number] ? ?PCP: Troy Rua, MD ? ?Date of Evaluation: 03/30/2021 ?Time spent:20 minutes ? ?Chief Complaint:  ?Chief Complaint   ?Anxiety; Follow-up ?  ? ? ? ?HISTORY/CURRENT STATUS: ?HPI For 6 month med check.  ? ?Troy Mercer is doing really well.  He still has anxiety occasionally, takes Xanax 0.25 mg, 1/2 pill and is usually enough, 30 pills can last as long as 6 months.  He has tried Zoloft in the past to help prevent the anxiety but made him feel terrible.  Since he is not needing a benzo very often he has been prescribed that only.  Does not really have panic attacks but more so a generalized sense of unease and usually in the evenings, when he can tell if he does not take either the Xanax or melatonin then he cannot go to sleep due to racing thoughts.  He is only taking the melatonin maybe 2 or 3 times a week. ? ?He has been doing well otherwise.  Work is going fine.  He is able to enjoy things, had a few trips to snowboard this past winter.  Energy and motivation are good.  ADLs and personal hygiene are normal.  No suicidal or homicidal thoughts. ? ?Denies dizziness, syncope, seizures, numbness, tingling, tremor, tics, unsteady gait, slurred speech, confusion. Denies muscle or joint pain, stiffness, or dystonia. ? ?Individual Medical History/ Review of Systems: Changes? :No     ? ? ?Past medications for mental health diagnoses include: ?Zoloft caused insomnia and made him feel awful.  ? ?Allergies: Codeine and Penicillins ? ?Current Medications:  ?Current Outpatient Medications:  ?  calcium carbonate (TUMS - DOSED IN MG ELEMENTAL CALCIUM) 500 MG chewable tablet, Chew 2 tablets by mouth 2 (two) times daily as needed for heartburn. , Disp: , Rfl:  ?  Melatonin 5 MG CAPS, Take by mouth., Disp: , Rfl:  ?  Multiple Vitamins-Minerals (EMERGEN-C IMMUNE PO), Take by mouth., Disp: , Rfl:  ?  naproxen sodium (ANAPROX) 220 MG tablet, Take 440 mg by mouth 2  (two) times daily as needed (for pain)., Disp: , Rfl:  ?  ALPRAZolam (XANAX) 0.25 MG tablet, 1/2-2 po qd prn., Disp: 30 tablet, Rfl: 2 ?  famotidine (PEPCID) 20 MG tablet, Take 1 tablet (20 mg total) by mouth 2 (two) times daily. (Patient not taking: Reported on 03/30/2021), Disp: 10 tablet, Rfl: 0 ?Medication Side Effects: none ? ?Family Medical/ Social History: Changes? No ? ?MENTAL HEALTH EXAM: ? ?There were no vitals taken for this visit.There is no height or weight on file to calculate BMI.  ?General Appearance: Casual and Well Groomed  ?Eye Contact:  Good  ?Speech:  Clear and Coherent and Normal Rate  ?Volume:  Normal  ?Mood:  Euthymic  ?Affect:  Appropriate  ?Thought Process:  Goal Directed and Descriptions of Associations: Circumstantial  ?Orientation:  Full (Time, Place, and Person)  ?Thought Content: Logical   ?Suicidal Thoughts:  No  ?Homicidal Thoughts:  No  ?Memory:  WNL  ?Judgement:  Good  ?Insight:  Good  ?Psychomotor Activity:  Normal  ?Concentration:  Concentration: Good and Attention Span: Good  ?Recall:  Good  ?Fund of Knowledge: Good  ?Language: Good  ?Assets:  Desire for Improvement ?Financial Resources/Insurance ?Housing ?Transportation ?Vocational/Educational  ?ADL's:  Intact  ?Cognition: WNL  ?Prognosis:  Good  ? ? ?DIAGNOSES:  ?  ICD-10-CM   ?1. Social anxiety disorder  F40.10   ?  ?2. Insomnia,  unspecified type  G47.00   ?  ? ? ?Receiving Psychotherapy: Yes Elio Forget, [facility]. ? ? ?RECOMMENDATIONS:  ?PDMP was reviewed.  Xanax last filled 02/12/2021.   ?I provided 20 minutes of face to face time during this encounter, including time spent before and after the visit in records review, medical decision making, counseling pertinent to today's visit, and charting.  ?He is doing well so no changes in medications are necessary. ? ?Continue Xanax 0.25 mg, 1/2-1 p.o. daily as needed anxiety. ?Continue therapy with Elio Forget, [facility]. ?Return in 6 months. ? ?Melony Overly, PA-C  ?

## 2021-04-11 DIAGNOSIS — M9902 Segmental and somatic dysfunction of thoracic region: Secondary | ICD-10-CM | POA: Diagnosis not present

## 2021-04-11 DIAGNOSIS — M9903 Segmental and somatic dysfunction of lumbar region: Secondary | ICD-10-CM | POA: Diagnosis not present

## 2021-04-11 DIAGNOSIS — M9904 Segmental and somatic dysfunction of sacral region: Secondary | ICD-10-CM | POA: Diagnosis not present

## 2021-04-11 DIAGNOSIS — M5408 Panniculitis affecting regions of neck and back, sacral and sacrococcygeal region: Secondary | ICD-10-CM | POA: Diagnosis not present

## 2021-04-27 ENCOUNTER — Ambulatory Visit: Payer: BC Managed Care – PPO | Admitting: Mental Health

## 2021-04-27 DIAGNOSIS — F4010 Social phobia, unspecified: Secondary | ICD-10-CM
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
## 2021-05-23 DIAGNOSIS — M9903 Segmental and somatic dysfunction of lumbar region: Secondary | ICD-10-CM | POA: Diagnosis not present

## 2021-05-23 DIAGNOSIS — M9902 Segmental and somatic dysfunction of thoracic region: Secondary | ICD-10-CM | POA: Diagnosis not present

## 2021-05-23 DIAGNOSIS — M9904 Segmental and somatic dysfunction of sacral region: Secondary | ICD-10-CM | POA: Diagnosis not present

## 2021-05-23 DIAGNOSIS — M5408 Panniculitis affecting regions of neck and back, sacral and sacrococcygeal region: Secondary | ICD-10-CM | POA: Diagnosis not present

## 2021-06-08 ENCOUNTER — Ambulatory Visit (INDEPENDENT_AMBULATORY_CARE_PROVIDER_SITE_OTHER): Payer: BC Managed Care – PPO | Admitting: Mental Health

## 2021-06-08 DIAGNOSIS — F4010 Social phobia, unspecified: Secondary | ICD-10-CM | POA: Diagnosis not present

## 2021-06-08 NOTE — Progress Notes (Signed)
[facility] Counselor / therapist psychotherapy note  Name: Troy Mercer Date: 06/08/21 MRN: [id number]  DOB: 23-Aug-1970  PCP: Joycelyn Rua, MD  Time spent: 55 minutes  Treatment: individual therapy  Mental Status Exam:    Appearance:   casual  Behavior:  WNL  Motor:  WNL  `  Clear and Coherent  Affect:  Full range  Mood:  Anxious, pleasant  Thought process:  normal  Thought content:    WNL  Sensory/Perceptual disturbances:    WNL  Orientation:  oriented to person, place, time/date and situation  Attention:  Good  Concentration:  Good  Memory:  WNL  Fund of knowledge:   Good  Insight:    Good  Judgment:   Good  Impulse Control:  Good   Reported Symptoms:  Panic attacks, anxiety most days, some lower motivation (improving), chest pressure  Risk Assessment: Danger to Self:  No Self-injurious Behavior: No Danger to Others: No Duty to Warn:no Physical Aggression / Violence:No  Access to Firearms a concern: No  Gang Involvement:No  Patient / guardian was educated about steps to take if suicide or homicide risk level increases between visits: no While future psychiatric events cannot be accurately predicted, the patient does not currently require acute inpatient psychiatric care and does not currently meet [facility] involuntary commitment criteria.  Medications: Current Outpatient Medications  Medication Sig Dispense Refill   ALPRAZolam (XANAX) 0.25 MG tablet 1/2-2 po qd prn. 30 tablet 2   calcium carbonate (TUMS - DOSED IN MG ELEMENTAL CALCIUM) 500 MG chewable tablet Chew 2 tablets by mouth 2 (two) times daily as needed for heartburn.      famotidine (PEPCID) 20 MG tablet Take 1 tablet (20 mg total) by mouth 2 (two) times daily. (Patient not taking: Reported on 03/30/2021) 10 tablet 0   Melatonin 5 MG CAPS Take by mouth.     Multiple Vitamins-Minerals (EMERGEN-C IMMUNE PO) Take by mouth.     naproxen sodium (ANAPROX) 220 MG tablet Take 440 mg by mouth 2 (two) times  daily as needed (for pain).     No current facility-administered medications for this visit.    Allergies  Allergen Reactions   Codeine Nausea And Vomiting   Penicillins Itching and Rash    Diagnoses:    ICD-10-CM   1. Social anxiety disorder  F40.10           Subjective:  Patient presents for session on time.  Assess progress toward goals as well as relevant recent events.  Patient shared how his wife has returned to work after being off for several months after being laid off.  Patient shared how he feels this will be helpful to her as she is work full-time throughout most of their marriage; other aspects of the relationship where shared where their relationship is going well, highly supportive of one another.  Patient shared his efforts to continue to try and manage his anxiety, specifically occurring weekly with scheduled office meetings where he presents his departmental information.  Facilitated patient further exploring potentially related history where he focused on family relationships, experiences that are potential factors for his coping with some levels of anxiety and self-imposed pressure.  Encouraged him to continue to reflect on past experiences that continue to play potential role in his present experiences with anxiety.   Interventions: CBT, supportive therapy, problem solving  Individualized Plan of Care:  1. Patient to engage psychiatric evaluation and follow medication regimen.  2. Patient to engage in individual psychotherapy.  3. Patient to identify and apply coping skills learned in session to decrease anxiety / panic.  4. Patient to learn and apply CBT, coping skills and strategies learned in session.  5. Patient to contact this office, go to the local ED or call 911 if a crisis or emergency develops between visits.   Progress: Progressing  [name], [facility]

## 2021-07-04 DIAGNOSIS — M9903 Segmental and somatic dysfunction of lumbar region: Secondary | ICD-10-CM | POA: Diagnosis not present

## 2021-07-04 DIAGNOSIS — M5408 Panniculitis affecting regions of neck and back, sacral and sacrococcygeal region: Secondary | ICD-10-CM | POA: Diagnosis not present

## 2021-07-04 DIAGNOSIS — M9902 Segmental and somatic dysfunction of thoracic region: Secondary | ICD-10-CM | POA: Diagnosis not present

## 2021-07-04 DIAGNOSIS — M9904 Segmental and somatic dysfunction of sacral region: Secondary | ICD-10-CM | POA: Diagnosis not present

## 2021-07-16 DIAGNOSIS — L814 Other melanin hyperpigmentation: Secondary | ICD-10-CM | POA: Diagnosis not present

## 2021-07-16 DIAGNOSIS — D225 Melanocytic nevi of trunk: Secondary | ICD-10-CM | POA: Diagnosis not present

## 2021-07-16 DIAGNOSIS — L820 Inflamed seborrheic keratosis: Secondary | ICD-10-CM | POA: Diagnosis not present

## 2021-07-20 ENCOUNTER — Ambulatory Visit: Payer: BC Managed Care – PPO | Admitting: Mental Health

## 2021-08-15 DIAGNOSIS — M9902 Segmental and somatic dysfunction of thoracic region: Secondary | ICD-10-CM | POA: Diagnosis not present

## 2021-08-15 DIAGNOSIS — M5408 Panniculitis affecting regions of neck and back, sacral and sacrococcygeal region: Secondary | ICD-10-CM | POA: Diagnosis not present

## 2021-08-15 DIAGNOSIS — M9903 Segmental and somatic dysfunction of lumbar region: Secondary | ICD-10-CM | POA: Diagnosis not present

## 2021-08-15 DIAGNOSIS — M9904 Segmental and somatic dysfunction of sacral region: Secondary | ICD-10-CM | POA: Diagnosis not present

## 2021-09-06 DIAGNOSIS — Z0000 Encounter for general adult medical examination without abnormal findings: Secondary | ICD-10-CM | POA: Diagnosis not present

## 2021-09-06 DIAGNOSIS — E7800 Pure hypercholesterolemia, unspecified: Secondary | ICD-10-CM | POA: Diagnosis not present

## 2021-09-06 DIAGNOSIS — Z131 Encounter for screening for diabetes mellitus: Secondary | ICD-10-CM | POA: Diagnosis not present

## 2021-09-14 ENCOUNTER — Ambulatory Visit (INDEPENDENT_AMBULATORY_CARE_PROVIDER_SITE_OTHER): Payer: BC Managed Care – PPO | Admitting: Mental Health

## 2021-09-14 DIAGNOSIS — F4010 Social phobia, unspecified: Secondary | ICD-10-CM | POA: Diagnosis not present

## 2021-09-14 NOTE — Progress Notes (Signed)
[facility] Counselor / therapist psychotherapy note  Name: Troy Mercer Date: 09/14/21 MRN: [id number]  DOB: 02/20/1970  PCP: Joycelyn Rua, MD  Time spent: 54 minutes  Treatment: individual therapy  Mental Status Exam:    Appearance:   casual  Behavior:  WNL  Motor:  WNL  `  Clear and Coherent  Affect:  Full range  Mood:  Anxious, pleasant  Thought process:  normal  Thought content:    WNL  Sensory/Perceptual disturbances:    WNL  Orientation:  oriented to person, place, time/date and situation  Attention:  Good  Concentration:  Good  Memory:  WNL  Fund of knowledge:   Good  Insight:    Good  Judgment:   Good  Impulse Control:  Good   Reported Symptoms:  Panic attacks, anxiety most days, some lower motivation (improving), chest pressure  Risk Assessment: Danger to Self:  No Self-injurious Behavior: No Danger to Others: No Duty to Warn:no Physical Aggression / Violence:No  Access to Firearms a concern: No  Gang Involvement:No  Patient / guardian was educated about steps to take if suicide or homicide risk level increases between visits: no While future psychiatric events cannot be accurately predicted, the patient does not currently require acute inpatient psychiatric care and does not currently meet [facility] involuntary commitment criteria.  Medications: Current Outpatient Medications  Medication Sig Dispense Refill   ALPRAZolam (XANAX) 0.25 MG tablet 1/2-2 po qd prn. 30 tablet 2   calcium carbonate (TUMS - DOSED IN MG ELEMENTAL CALCIUM) 500 MG chewable tablet Chew 2 tablets by mouth 2 (two) times daily as needed for heartburn.      famotidine (PEPCID) 20 MG tablet Take 1 tablet (20 mg total) by mouth 2 (two) times daily. (Patient not taking: Reported on 03/30/2021) 10 tablet 0   Melatonin 5 MG CAPS Take by mouth.     Multiple Vitamins-Minerals (EMERGEN-C IMMUNE PO) Take by mouth.     naproxen sodium (ANAPROX) 220 MG tablet Take 440 mg by mouth 2 (two) times  daily as needed (for pain).     No current facility-administered medications for this visit.    Allergies  Allergen Reactions   Codeine Nausea And Vomiting   Penicillins Itching and Rash    Diagnoses:    ICD-10-CM   1. Social anxiety disorder  F40.10        Subjective:  Patient presents for session on time.  Patient shared recent events and progress since last visit which was about 3 months ago.  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  Patient went on to share experiences, how he is adjusted with some increased stress.  Currently, he shared how things have stabilized, how he feels secure in his job position.  Explored family relationships where he stated that his wife has been able to secure a new position which will be more fulfilling for her.  Patient shared how this in turn has helped with his own stress and anxiety, also sharing some details related to positive interactions as a family with his son.  Collaboratively, we explored outlets for enjoyment and to allow himself to destress.  Interventions: CBT, supportive therapy, problem solving  Individualized Plan of Care:  1. Patient to engage psychiatric evaluation and follow medication regimen.  2. Patient to engage in individual psychotherapy.  3. Patient to identify and apply coping skills learned in session to decrease anxiety /  panic.  4. Patient to learn and apply CBT, coping skills and strategies learned in session.  5. Patient to contact this office, go to the local ED or call 911 if a crisis or emergency develops between visits.   Progress: Progressing  Anson Oregon, [facility]

## 2021-09-26 DIAGNOSIS — M5408 Panniculitis affecting regions of neck and back, sacral and sacrococcygeal region: Secondary | ICD-10-CM | POA: Diagnosis not present

## 2021-09-26 DIAGNOSIS — M9902 Segmental and somatic dysfunction of thoracic region: Secondary | ICD-10-CM | POA: Diagnosis not present

## 2021-09-26 DIAGNOSIS — M9904 Segmental and somatic dysfunction of sacral region: Secondary | ICD-10-CM | POA: Diagnosis not present

## 2021-09-26 DIAGNOSIS — M9903 Segmental and somatic dysfunction of lumbar region: Secondary | ICD-10-CM | POA: Diagnosis not present

## 2021-09-28 ENCOUNTER — Ambulatory Visit: Payer: BC Managed Care – PPO | Admitting: Physician Assistant

## 2021-11-07 DIAGNOSIS — M9903 Segmental and somatic dysfunction of lumbar region: Secondary | ICD-10-CM | POA: Diagnosis not present

## 2021-11-07 DIAGNOSIS — M9902 Segmental and somatic dysfunction of thoracic region: Secondary | ICD-10-CM | POA: Diagnosis not present

## 2021-11-07 DIAGNOSIS — M5408 Panniculitis affecting regions of neck and back, sacral and sacrococcygeal region: Secondary | ICD-10-CM | POA: Diagnosis not present

## 2021-11-07 DIAGNOSIS — M9904 Segmental and somatic dysfunction of sacral region: Secondary | ICD-10-CM | POA: Diagnosis not present

## 2021-11-09 ENCOUNTER — Telehealth: Payer: Self-pay | Admitting: Physician Assistant

## 2021-11-09 ENCOUNTER — Ambulatory Visit: Payer: BC Managed Care – PPO | Admitting: Physician Assistant

## 2021-11-09 ENCOUNTER — Other Ambulatory Visit: Payer: Self-pay

## 2021-11-09 MED ORDER — ALPRAZOLAM 0.25 MG PO TABS: ORAL_TABLET | ORAL | 0 refills | Status: DC

## 2021-11-09 NOTE — Telephone Encounter (Signed)
Pended.

## 2021-11-09 NOTE — Telephone Encounter (Signed)
Pt requesting RF Alprazolam @ [place]. Apt [address] 2 times by provider sick. Next apt 12/8

## 2021-11-23 ENCOUNTER — Ambulatory Visit: Payer: BC Managed Care – PPO | Admitting: Mental Health

## 2021-11-23 DIAGNOSIS — F4010 Social phobia, unspecified: Secondary | ICD-10-CM | POA: Diagnosis not present

## 2021-11-23 NOTE — Progress Notes (Signed)
[facility] Counselor / therapist psychotherapy note  Name: Troy Mercer Date: 09/14/21 MRN: [id number]  DOB: [birth year]  PCP: Joycelyn Rua, MD  Time spent: 54 minutes  Treatment: individual therapy  Mental Status Exam:    Appearance:   casual  Behavior:  WNL  Motor:  WNL  `  Clear and Coherent  Affect:  Full range  Mood:  Anxious, pleasant  Thought process:  normal  Thought content:    WNL  Sensory/Perceptual disturbances:    WNL  Orientation:  oriented to person, place, time/date and situation  Attention:  Good  Concentration:  Good  Memory:  WNL  Fund of knowledge:   Good  Insight:    Good  Judgment:   Good  Impulse Control:  Good   Reported Symptoms:  Panic attacks, anxiety most days, some lower motivation (improving), chest pressure  Risk Assessment: Danger to Self:  No Self-injurious Behavior: No Danger to Others: No Duty to Warn:no Physical Aggression / Violence:No  Access to Firearms a concern: No  Gang Involvement:No  Patient / guardian was educated about steps to take if suicide or homicide risk level increases between visits: no While future psychiatric events cannot be accurately predicted, the patient does not currently require acute inpatient psychiatric care and does not currently meet [facility] involuntary commitment criteria.  Medications: Current Outpatient Medications  Medication Sig Dispense Refill   ALPRAZolam (XANAX) 0.25 MG tablet 1/2-2 po qd prn. 30 tablet 0   calcium carbonate (TUMS - DOSED IN MG ELEMENTAL CALCIUM) 500 MG chewable tablet Chew 2 tablets by mouth 2 (two) times daily as needed for heartburn.      famotidine (PEPCID) 20 MG tablet Take 1 tablet (20 mg total) by mouth 2 (two) times daily. (Patient not taking: Reported on 03/30/2021) 10 tablet 0   Melatonin 5 MG CAPS Take by mouth.     Multiple Vitamins-Minerals (EMERGEN-C IMMUNE PO) Take by mouth.     naproxen sodium (ANAPROX) 220 MG tablet Take 440 mg by mouth 2 (two) times  daily as needed (for pain).     No current facility-administered medications for this visit.    Allergies  Allergen Reactions   Codeine Nausea And Vomiting   Penicillins Itching and Rash    Diagnoses:  No diagnosis found.    Subjective:  Patient presents for session on time.  Patient shared recent events and progress since last visit which was about 3 months ago.  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  Patient went on to share experiences, how he is adjusted with some increased stress.  Currently, he shared how things have stabilized, how he feels secure in his job position.  Explored family relationships where he stated that his wife has been able to secure a new position which will be more fulfilling for her.  Patient shared how this in turn has helped with his own stress and anxiety, also sharing some details related to positive interactions as a family with his son.  Collaboratively, we explored outlets for enjoyment and to allow himself to destress.  Interventions: CBT, supportive therapy, problem solving  Individualized Plan of Care:  1. Patient to engage psychiatric evaluation and follow medication regimen.  2. Patient to engage in individual psychotherapy.  3. Patient to identify and apply coping skills learned in session to decrease anxiety / panic.  4. Patient to learn and apply CBT, coping skills and  strategies learned in session.  5. Patient to contact this office, go to the local ED or call 911 if a crisis or emergency develops between visits.   Progress: Progressing  Anson Oregon, [facility]

## 2021-12-14 ENCOUNTER — Ambulatory Visit: Payer: BC Managed Care – PPO | Admitting: Physician Assistant

## 2021-12-19 DIAGNOSIS — M5408 Panniculitis affecting regions of neck and back, sacral and sacrococcygeal region: Secondary | ICD-10-CM | POA: Diagnosis not present

## 2021-12-19 DIAGNOSIS — M9902 Segmental and somatic dysfunction of thoracic region: Secondary | ICD-10-CM | POA: Diagnosis not present

## 2021-12-19 DIAGNOSIS — M9903 Segmental and somatic dysfunction of lumbar region: Secondary | ICD-10-CM | POA: Diagnosis not present

## 2021-12-19 DIAGNOSIS — M9904 Segmental and somatic dysfunction of sacral region: Secondary | ICD-10-CM | POA: Diagnosis not present

## 2022-01-11 ENCOUNTER — Ambulatory Visit (INDEPENDENT_AMBULATORY_CARE_PROVIDER_SITE_OTHER): Payer: BC Managed Care – PPO | Admitting: Mental Health

## 2022-01-11 DIAGNOSIS — F4010 Social phobia, unspecified: Secondary | ICD-10-CM | POA: Diagnosis not present

## 2022-01-11 NOTE — Progress Notes (Signed)
[facility] Counselor / therapist psychotherapy note  Name: Troy Mercer Date: 01/11/22 MRN: [id number]  DOB: 1970/08/30  PCP: Orpah Melter, MD  Time spent: 50 minutes  Treatment: individual therapy  Mental Status Exam:    Appearance:   casual  Behavior:  WNL  Motor:  WNL  `  Clear and Coherent  Affect:  Full range  Mood:  Anxious, pleasant  Thought process:  normal  Thought content:    WNL  Sensory/Perceptual disturbances:    WNL  Orientation:  oriented to person, place, time/date and situation  Attention:  Good  Concentration:  Good  Memory:  WNL  Fund of knowledge:   Good  Insight:    Good  Judgment:   Good  Impulse Control:  Good   Reported Symptoms:  Panic attacks, anxiety most days, some lower motivation (improving), chest pressure  Risk Assessment: Danger to Self:  No Self-injurious Behavior: No Danger to Others: No Duty to Warn:no Physical Aggression / Violence:No  Access to Firearms a concern: No  Gang Involvement:No  Patient / guardian was educated about steps to take if suicide or homicide risk level increases between visits: no While future psychiatric events cannot be accurately predicted, the patient does not currently require acute inpatient psychiatric care and does not currently meet [facility] involuntary commitment criteria.  Medications: Current Outpatient Medications  Medication Sig Dispense Refill   ALPRAZolam (XANAX) 0.25 MG tablet 1/2-2 po qd prn. 30 tablet 0   calcium carbonate (TUMS - DOSED IN MG ELEMENTAL CALCIUM) 500 MG chewable tablet Chew 2 tablets by mouth 2 (two) times daily as needed for heartburn.      famotidine (PEPCID) 20 MG tablet Take 1 tablet (20 mg total) by mouth 2 (two) times daily. (Patient not taking: Reported on 03/30/2021) 10 tablet 0   Melatonin 5 MG CAPS Take by mouth.     Multiple Vitamins-Minerals (EMERGEN-C IMMUNE PO) Take by mouth.     naproxen sodium (ANAPROX) 220 MG tablet Take 440 mg by mouth 2 (two) times  daily as needed (for pain).     No current facility-administered medications for this visit.    Allergies  Allergen Reactions   Codeine Nausea And Vomiting   Penicillins Itching and Rash    Diagnoses:    ICD-10-CM   1. Social anxiety disorder  F40.10          Subjective:  Patient presents for session on time.  Patient shared recent events in progress.  Some work-related stress was shared.  Some ongoing anxiety related to meetings where he has to present which is often.  Patient identified how he works to remind himself of his abilities, not only with his successes in his position but also specifically his ability to communicate clearly in meetings and how he knows that others give him positive feedback in which he tries to remind himself.  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  Family relationships were assessed, he reports he and his wife are doing very well in their relationship and his son also.  Interventions: CBT, supportive therapy, problem solving  Individualized Plan of Care:  1. Patient to engage psychiatric evaluation and follow medication regimen.  2. Patient to engage in individual psychotherapy.  3. Patient to identify and apply coping skills learned in session to decrease anxiety / panic.  4. Patient to learn and apply CBT, coping skills and strategies learned in session.  5. Patient to contact this office, go to the local ED or call 911 if a crisis or emergency develops between visits.   Progress: Progressing  Anson [place], [facility]

## 2022-01-30 DIAGNOSIS — M9902 Segmental and somatic dysfunction of thoracic region: Secondary | ICD-10-CM | POA: Diagnosis not present

## 2022-01-30 DIAGNOSIS — M9903 Segmental and somatic dysfunction of lumbar region: Secondary | ICD-10-CM | POA: Diagnosis not present

## 2022-01-30 DIAGNOSIS — M5408 Panniculitis affecting regions of neck and back, sacral and sacrococcygeal region: Secondary | ICD-10-CM | POA: Diagnosis not present

## 2022-01-30 DIAGNOSIS — M9904 Segmental and somatic dysfunction of sacral region: Secondary | ICD-10-CM | POA: Diagnosis not present

## 2022-03-01 ENCOUNTER — Ambulatory Visit (INDEPENDENT_AMBULATORY_CARE_PROVIDER_SITE_OTHER): Payer: BC Managed Care – PPO | Admitting: Mental Health

## 2022-03-01 DIAGNOSIS — F4010 Social phobia, unspecified: Secondary | ICD-10-CM | POA: Diagnosis not present

## 2022-03-04 NOTE — Progress Notes (Signed)
[facility] Counselor / therapist psychotherapy note  Name: ANNA GULDNER Date: 03/01/22 MRN: [id number]  DOB: Apr 04, 1970  PCP: Orpah Melter, MD  Time spent: 52 minutes  Treatment: individual therapy  Mental Status Exam:    Appearance:   casual  Behavior:  WNL  Motor:  WNL  `  Clear and Coherent  Affect:  Full range  Mood:  Anxious, pleasant  Thought process:  normal  Thought content:    WNL  Sensory/Perceptual disturbances:    WNL  Orientation:  oriented to person, place, time/date and situation  Attention:  Good  Concentration:  Good  Memory:  WNL  Fund of knowledge:   Good  Insight:    Good  Judgment:   Good  Impulse Control:  Good   Reported Symptoms:  Panic attacks, anxiety most days, some lower motivation (improving), chest pressure  Risk Assessment: Danger to Self:  No Self-injurious Behavior: No Danger to Others: No Duty to Warn:no Physical Aggression / Violence:No  Access to Firearms a concern: No  Gang Involvement:No  Patient / guardian was educated about steps to take if suicide or homicide risk level increases between visits: no While future psychiatric events cannot be accurately predicted, the patient does not currently require acute inpatient psychiatric care and does not currently meet [facility] involuntary commitment criteria.  Medications: Current Outpatient Medications  Medication Sig Dispense Refill   ALPRAZolam (XANAX) 0.25 MG tablet 1/2-2 po qd prn. 30 tablet 0   calcium carbonate (TUMS - DOSED IN MG ELEMENTAL CALCIUM) 500 MG chewable tablet Chew 2 tablets by mouth 2 (two) times daily as needed for heartburn.      famotidine (PEPCID) 20 MG tablet Take 1 tablet (20 mg total) by mouth 2 (two) times daily. (Patient not taking: Reported on 03/30/2021) 10 tablet 0   Melatonin 5 MG CAPS Take by mouth.     Multiple Vitamins-Minerals (EMERGEN-C IMMUNE PO) Take by mouth.     naproxen sodium (ANAPROX) 220 MG tablet Take 440 mg by mouth 2 (two)  times daily as needed (for pain).     No current facility-administered medications for this visit.    Allergies  Allergen Reactions   Codeine Nausea And Vomiting   Penicillins Itching and Rash    Diagnoses:    ICD-10-CM   1. Social anxiety disorder  F40.10           Subjective:  Client presents for session on time.  Client is adjusting to their son's decision to quit soccer. Client wants to ensure they are providing the best support for their child.Session Summary: Today's session focused on client's ongoing adjustment to their son's choice to quit soccer. We discussed the importance of being supportive and communicating in ways that make their child feel heard and understood. We also explored reasons behind their son's decision to ensure there are no underlying issues requiring additional support. Client acknowledged the importance of being supportive and expressed a desire to learn specific communication strategies to achieve this. They also explored their son's reasons for quitting, demonstrating genuine concern for his well-being.Client Progress: Client reported overall positive progress, with some residual anxiety around presentations at work. We reviewed coping mechanisms and client confirmed their continued use.   Interventions: CBT, supportive therapy, problem solving  Individualized Plan of Care:  1. Patient to engage psychiatric evaluation and follow medication regimen.  2. Patient to engage in individual psychotherapy.  3. Patient to identify and apply coping skills learned in session to decrease anxiety /  panic.  4. Patient to learn and apply CBT, coping skills and strategies learned in session.  5. Patient to contact this office, go to the local ED or call 911 if a crisis or emergency develops between visits.   Progress: Progressing  Anson [place], [facility]

## 2022-03-13 DIAGNOSIS — M9904 Segmental and somatic dysfunction of sacral region: Secondary | ICD-10-CM | POA: Diagnosis not present

## 2022-03-13 DIAGNOSIS — M5408 Panniculitis affecting regions of neck and back, sacral and sacrococcygeal region: Secondary | ICD-10-CM | POA: Diagnosis not present

## 2022-03-13 DIAGNOSIS — M9902 Segmental and somatic dysfunction of thoracic region: Secondary | ICD-10-CM | POA: Diagnosis not present

## 2022-03-13 DIAGNOSIS — M9903 Segmental and somatic dysfunction of lumbar region: Secondary | ICD-10-CM | POA: Diagnosis not present

## 2022-04-12 ENCOUNTER — Ambulatory Visit (INDEPENDENT_AMBULATORY_CARE_PROVIDER_SITE_OTHER): Payer: BC Managed Care – PPO | Admitting: Mental Health

## 2022-04-12 DIAGNOSIS — F4010 Social phobia, unspecified: Secondary | ICD-10-CM | POA: Diagnosis not present

## 2022-04-24 DIAGNOSIS — M9904 Segmental and somatic dysfunction of sacral region: Secondary | ICD-10-CM | POA: Diagnosis not present

## 2022-04-24 DIAGNOSIS — M5408 Panniculitis affecting regions of neck and back, sacral and sacrococcygeal region: Secondary | ICD-10-CM | POA: Diagnosis not present

## 2022-04-24 DIAGNOSIS — M9902 Segmental and somatic dysfunction of thoracic region: Secondary | ICD-10-CM | POA: Diagnosis not present

## 2022-04-24 DIAGNOSIS — M9903 Segmental and somatic dysfunction of lumbar region: Secondary | ICD-10-CM | POA: Diagnosis not present

## 2022-04-30 NOTE — Progress Notes (Signed)
[facility] Counselor / therapist psychotherapy note  Name: Troy Mercer Date: 04/12/22 MRN: [id number]  DOB: 04-11-1970  PCP: Joycelyn Rua, MD  Time spent: 51 minutes  Treatment: individual therapy  Mental Status Exam:    Appearance:   casual  Behavior:  WNL  Motor:  WNL  `  Clear and Coherent  Affect:  Full range  Mood:  Anxious, pleasant  Thought process:  normal  Thought content:    WNL  Sensory/Perceptual disturbances:    WNL  Orientation:  oriented to person, place, time/date and situation  Attention:  Good  Concentration:  Good  Memory:  WNL  Fund of knowledge:   Good  Insight:    Good  Judgment:   Good  Impulse Control:  Good   Reported Symptoms:  Panic attacks, anxiety most days, some lower motivation (improving), chest pressure  Risk Assessment: Danger to Self:  No Self-injurious Behavior: No Danger to Others: No Duty to Warn:no Physical Aggression / Violence:No  Access to Firearms a concern: No  Gang Involvement:No  Patient / guardian was educated about steps to take if suicide or homicide risk level increases between visits: no While future psychiatric events cannot be accurately predicted, the patient does not currently require acute inpatient psychiatric care and does not currently meet [facility] involuntary commitment criteria.  Medications: Current Outpatient Medications  Medication Sig Dispense Refill   ALPRAZolam (XANAX) 0.25 MG tablet 1/2-2 po qd prn. 30 tablet 0   calcium carbonate (TUMS - DOSED IN MG ELEMENTAL CALCIUM) 500 MG chewable tablet Chew 2 tablets by mouth 2 (two) times daily as needed for heartburn.      famotidine (PEPCID) 20 MG tablet Take 1 tablet (20 mg total) by mouth 2 (two) times daily. (Patient not taking: Reported on 03/30/2021) 10 tablet 0   Melatonin 5 MG CAPS Take by mouth.     Multiple Vitamins-Minerals (EMERGEN-C IMMUNE PO) Take by mouth.     naproxen sodium (ANAPROX) 220 MG tablet Take 440 mg by mouth 2 (two) times  daily as needed (for pain).     No current facility-administered medications for this visit.    Allergies  Allergen Reactions   Codeine Nausea And Vomiting   Penicillins Itching and Rash    Diagnoses:    ICD-10-CM   1. Social anxiety disorder  F40.10            Subjective:  Client presents for session on time.  He shared how he and his wife continue to adjust to the news that his son no longer wants to play soccer.  He admitted it is more upsetting for him and his wife he feels due to knowing how good his son is at soccer.  He stated that he is actively involved in golf and has taken to the sport a lot recently.  Facilitated his identifying some feelings as well as how he wants to handle the situation ongoing where he states he has followed through with just being supportive and encouraging to his son as discussed in previous sessions.  He shared some details related to a work-related stress, his continuing to meet his performance objectives, sharing his value he feels he adds to the company.  He stated that he had a helpful discussion with his supervisor recently where he took steps to be assertive and introducing the idea of his getting a pay raise potentially in the next few months if possible.  He stated that the conversation went well and they plan  to revisit to discuss more details in the coming weeks.  He continues to give his weekly reports in meetings, continues to have some anxiety related prior to the meeting; collaboratively, reviewed ways to cope, what to remind himself of prior to the meetings, how he is an effective speaker, has received many complements about not only his work performance but his ability to convey information.   Interventions: CBT, supportive therapy, problem solving  Individualized Plan of Care:  1. Patient to engage psychiatric evaluation and follow medication regimen.  2. Patient to engage in individual psychotherapy.  3. Patient to identify and  apply coping skills learned in session to decrease anxiety / panic.  4. Patient to learn and apply CBT, coping skills and strategies learned in session.  5. Patient to contact this office, go to the local ED or call 911 if a crisis or emergency develops between visits.   Progress: Progressing  [name], [facility]

## 2022-05-07 DIAGNOSIS — M9902 Segmental and somatic dysfunction of thoracic region: Secondary | ICD-10-CM | POA: Diagnosis not present

## 2022-05-07 DIAGNOSIS — M5408 Panniculitis affecting regions of neck and back, sacral and sacrococcygeal region: Secondary | ICD-10-CM | POA: Diagnosis not present

## 2022-05-07 DIAGNOSIS — M9904 Segmental and somatic dysfunction of sacral region: Secondary | ICD-10-CM | POA: Diagnosis not present

## 2022-05-07 DIAGNOSIS — M9903 Segmental and somatic dysfunction of lumbar region: Secondary | ICD-10-CM | POA: Diagnosis not present

## 2022-05-31 ENCOUNTER — Ambulatory Visit: Payer: BC Managed Care – PPO | Admitting: Mental Health

## 2022-05-31 DIAGNOSIS — F4010 Social phobia, unspecified: Secondary | ICD-10-CM

## 2022-06-05 NOTE — Progress Notes (Signed)
[facility] Counselor / therapist psychotherapy note  Name: Troy Mercer Date: 05/31/22 MRN: [id number]  DOB: 1970/01/18  PCP: Joycelyn Rua, MD  Time spent: 48 minutes  Treatment: individual therapy  Mental Status Exam:    Appearance:   casual  Behavior:  WNL  Motor:  WNL  `  Clear and Coherent  Affect:  Full range  Mood:  Anxious, pleasant  Thought process:  normal  Thought content:    WNL  Sensory/Perceptual disturbances:    WNL  Orientation:  oriented to person, place, time/date and situation  Attention:  Good  Concentration:  Good  Memory:  WNL  Fund of knowledge:   Good  Insight:    Good  Judgment:   Good  Impulse Control:  Good   Reported Symptoms:  Panic attacks, anxiety most days, some lower motivation (improving), chest pressure  Risk Assessment: Danger to Self:  No Self-injurious Behavior: No Danger to Others: No Duty to Warn:no Physical Aggression / Violence:No  Access to Firearms a concern: No  Gang Involvement:No  Patient / guardian was educated about steps to take if suicide or homicide risk level increases between visits: no While future psychiatric events cannot be accurately predicted, the patient does not currently require acute inpatient psychiatric care and does not currently meet [facility] involuntary commitment criteria.  Medications: Current Outpatient Medications  Medication Sig Dispense Refill   ALPRAZolam (XANAX) 0.25 MG tablet 1/2-2 po qd prn. 30 tablet 0   calcium carbonate (TUMS - DOSED IN MG ELEMENTAL CALCIUM) 500 MG chewable tablet Chew 2 tablets by mouth 2 (two) times daily as needed for heartburn.      famotidine (PEPCID) 20 MG tablet Take 1 tablet (20 mg total) by mouth 2 (two) times daily. (Patient not taking: Reported on 03/30/2021) 10 tablet 0   Melatonin 5 MG CAPS Take by mouth.     Multiple Vitamins-Minerals (EMERGEN-C IMMUNE PO) Take by mouth.     naproxen sodium (ANAPROX) 220 MG tablet Take 440 mg by mouth 2 (two)  times daily as needed (for pain).     No current facility-administered medications for this visit.    Allergies  Allergen Reactions   Codeine Nausea And Vomiting   Penicillins Itching and Rash    Diagnoses:  No diagnosis found.    Subjective:  Client presents for session on time.  He shared progress related to his anxiety at work.  He continues to be mindful of thoughts associated with his anxiety specifically when giving weekly presentations and meetings.  Facilitated his identifying thoughts associated where he works to reframe and other ways he tries to stay calm particularly just prior to his presenting where anxiety can be highest.  He went on to share positive changes with family, his son continuing to enjoy playing soccer.  He stated that his son had recently expressed wanting to discontinue playing soccer and just focus on other sports.  Patient stated he followed through with providing support and understanding to his son and not putting any pressure on him, thus allowing him to make his own decision.  He went on to share how he is doing well at work and is being patient regarding a pay increase that he had requested be reviewed about a month ago.  Interventions: CBT, supportive therapy, problem solving  Individualized Plan of Care:  1. Patient to engage psychiatric evaluation and follow medication regimen.  2. Patient to engage in individual psychotherapy.  3. Patient to identify and apply coping skills  learned in session to decrease anxiety / panic.  4. Patient to learn and apply CBT, coping skills and strategies learned in session.  5. Patient to contact this office, go to the local ED or call 911 if a crisis or emergency develops between visits.   Progress: Progressing  [name], [facility]

## 2022-06-19 DIAGNOSIS — M9903 Segmental and somatic dysfunction of lumbar region: Secondary | ICD-10-CM | POA: Diagnosis not present

## 2022-06-19 DIAGNOSIS — M9904 Segmental and somatic dysfunction of sacral region: Secondary | ICD-10-CM | POA: Diagnosis not present

## 2022-06-19 DIAGNOSIS — M5408 Panniculitis affecting regions of neck and back, sacral and sacrococcygeal region: Secondary | ICD-10-CM | POA: Diagnosis not present

## 2022-06-19 DIAGNOSIS — M9902 Segmental and somatic dysfunction of thoracic region: Secondary | ICD-10-CM | POA: Diagnosis not present

## 2022-07-26 ENCOUNTER — Ambulatory Visit (INDEPENDENT_AMBULATORY_CARE_PROVIDER_SITE_OTHER): Payer: BC Managed Care – PPO | Admitting: Mental Health

## 2022-07-26 DIAGNOSIS — F4010 Social phobia, unspecified: Secondary | ICD-10-CM

## 2022-07-30 DIAGNOSIS — D229 Melanocytic nevi, unspecified: Secondary | ICD-10-CM | POA: Diagnosis not present

## 2022-07-30 DIAGNOSIS — C44619 Basal cell carcinoma of skin of left upper limb, including shoulder: Secondary | ICD-10-CM | POA: Diagnosis not present

## 2022-07-30 DIAGNOSIS — L738 Other specified follicular disorders: Secondary | ICD-10-CM | POA: Diagnosis not present

## 2022-07-30 DIAGNOSIS — L814 Other melanin hyperpigmentation: Secondary | ICD-10-CM | POA: Diagnosis not present

## 2022-07-30 DIAGNOSIS — D485 Neoplasm of uncertain behavior of skin: Secondary | ICD-10-CM | POA: Diagnosis not present

## 2022-07-30 DIAGNOSIS — L820 Inflamed seborrheic keratosis: Secondary | ICD-10-CM | POA: Diagnosis not present

## 2022-07-31 DIAGNOSIS — M5408 Panniculitis affecting regions of neck and back, sacral and sacrococcygeal region: Secondary | ICD-10-CM | POA: Diagnosis not present

## 2022-07-31 DIAGNOSIS — M9904 Segmental and somatic dysfunction of sacral region: Secondary | ICD-10-CM | POA: Diagnosis not present

## 2022-07-31 DIAGNOSIS — M9902 Segmental and somatic dysfunction of thoracic region: Secondary | ICD-10-CM | POA: Diagnosis not present

## 2022-07-31 DIAGNOSIS — M9903 Segmental and somatic dysfunction of lumbar region: Secondary | ICD-10-CM | POA: Diagnosis not present

## 2022-08-01 NOTE — Progress Notes (Signed)
[facility] Counselor / therapist psychotherapy note  Name: Troy Mercer Date: 07/26/22 MRN: [id number]  DOB: 1970-04-22  PCP: Troy Rua, MD  Time spent: 47 minutes  Treatment: individual therapy  Mental Status Exam:    Appearance:   casual  Behavior:  WNL  Motor:  WNL  `  Clear and Coherent  Affect:  Full range  Mood:  Anxious, pleasant  Thought process:  normal  Thought content:    WNL  Sensory/Perceptual disturbances:    WNL  Orientation:  oriented to person, place, time/date and situation  Attention:  Good  Concentration:  Good  Memory:  WNL  Fund of knowledge:   Good  Insight:    Good  Judgment:   Good  Impulse Control:  Good   Reported Symptoms:  Panic attacks, anxiety most days, some lower motivation (improving), chest pressure  Risk Assessment: Danger to Self:  No Self-injurious Behavior: No Danger to Others: No Duty to Warn:no Physical Aggression / Violence:No  Access to Firearms a concern: No  Gang Involvement:No  Patient / guardian was educated about steps to take if suicide or homicide risk level increases between visits: no While future psychiatric events cannot be accurately predicted, the patient does not currently require acute inpatient psychiatric care and does not currently meet [facility] involuntary commitment criteria.  Medications: Current Outpatient Medications  Medication Sig Dispense Refill   ALPRAZolam (XANAX) 0.25 MG tablet 1/2-2 po qd prn. 30 tablet 0   calcium carbonate (TUMS - DOSED IN MG ELEMENTAL CALCIUM) 500 MG chewable tablet Chew 2 tablets by mouth 2 (two) times daily as needed for heartburn.      famotidine (PEPCID) 20 MG tablet Take 1 tablet (20 mg total) by mouth 2 (two) times daily. (Patient not taking: Reported on 03/30/2021) 10 tablet 0   Melatonin 5 MG CAPS Take by mouth.     Multiple Vitamins-Minerals (EMERGEN-C IMMUNE PO) Take by mouth.     naproxen sodium (ANAPROX) 220 MG tablet Take 440 mg by mouth 2 (two)  times daily as needed (for pain).     No current facility-administered medications for this visit.    Allergies  Allergen Reactions   Codeine Nausea And Vomiting   Penicillins Itching and Rash    Diagnoses:  No diagnosis found.    Subjective:  Client presents for session on time.  Assessed progress where patient shared experiences with family, sharing more specific family dynamics he has with his wife's family.  He went on to share positive, enjoyable experiences he has with his own family on to share some details recently with his son.  He continues to await his supervisor's response regarding his getting a salary increase.  He has been had no problems validating this request and is hopeful for a positive outcome.  He continues to cope with some anxiety related to giving presentations at work which typically occur weekly and meetings and other instances.  Ways he continues to try to be mindful of his thoughts, he continues to work to reframe them toward being more self supportive and self reassuring.   Interventions: CBT, supportive therapy, problem solving  Individualized Plan of Care:  1. Patient to engage psychiatric evaluation and follow medication regimen.  2. Patient to engage in individual psychotherapy.  3. Patient to identify and apply coping skills learned in session to decrease anxiety / panic.  4. Patient to learn and apply CBT, coping skills and strategies learned in session.  5. Patient to contact this office,  go to the local ED or call 911 if a crisis or emergency develops between visits.   Progress: Progressing  [name], [facility]

## 2022-09-04 DIAGNOSIS — Z1212 Encounter for screening for malignant neoplasm of rectum: Secondary | ICD-10-CM | POA: Diagnosis not present

## 2022-09-04 DIAGNOSIS — Z1211 Encounter for screening for malignant neoplasm of colon: Secondary | ICD-10-CM | POA: Diagnosis not present

## 2022-09-11 DIAGNOSIS — M9904 Segmental and somatic dysfunction of sacral region: Secondary | ICD-10-CM | POA: Diagnosis not present

## 2022-09-11 DIAGNOSIS — M5408 Panniculitis affecting regions of neck and back, sacral and sacrococcygeal region: Secondary | ICD-10-CM | POA: Diagnosis not present

## 2022-09-11 DIAGNOSIS — M9903 Segmental and somatic dysfunction of lumbar region: Secondary | ICD-10-CM | POA: Diagnosis not present

## 2022-09-11 DIAGNOSIS — M9902 Segmental and somatic dysfunction of thoracic region: Secondary | ICD-10-CM | POA: Diagnosis not present

## 2022-09-18 DIAGNOSIS — C44619 Basal cell carcinoma of skin of left upper limb, including shoulder: Secondary | ICD-10-CM | POA: Diagnosis not present

## 2022-09-20 DIAGNOSIS — Z131 Encounter for screening for diabetes mellitus: Secondary | ICD-10-CM | POA: Diagnosis not present

## 2022-09-20 DIAGNOSIS — E7800 Pure hypercholesterolemia, unspecified: Secondary | ICD-10-CM | POA: Diagnosis not present

## 2022-09-25 DIAGNOSIS — Z0000 Encounter for general adult medical examination without abnormal findings: Secondary | ICD-10-CM | POA: Diagnosis not present

## 2022-10-30 DIAGNOSIS — M9902 Segmental and somatic dysfunction of thoracic region: Secondary | ICD-10-CM | POA: Diagnosis not present

## 2022-10-30 DIAGNOSIS — M5408 Panniculitis affecting regions of neck and back, sacral and sacrococcygeal region: Secondary | ICD-10-CM | POA: Diagnosis not present

## 2022-10-30 DIAGNOSIS — M9903 Segmental and somatic dysfunction of lumbar region: Secondary | ICD-10-CM | POA: Diagnosis not present

## 2022-10-30 DIAGNOSIS — M9904 Segmental and somatic dysfunction of sacral region: Secondary | ICD-10-CM | POA: Diagnosis not present

## 2022-12-12 DIAGNOSIS — M5408 Panniculitis affecting regions of neck and back, sacral and sacrococcygeal region: Secondary | ICD-10-CM | POA: Diagnosis not present

## 2022-12-12 DIAGNOSIS — M9903 Segmental and somatic dysfunction of lumbar region: Secondary | ICD-10-CM | POA: Diagnosis not present

## 2022-12-12 DIAGNOSIS — M9902 Segmental and somatic dysfunction of thoracic region: Secondary | ICD-10-CM | POA: Diagnosis not present

## 2022-12-12 DIAGNOSIS — M9904 Segmental and somatic dysfunction of sacral region: Secondary | ICD-10-CM | POA: Diagnosis not present

## 2023-01-15 DIAGNOSIS — D229 Melanocytic nevi, unspecified: Secondary | ICD-10-CM | POA: Diagnosis not present

## 2023-01-15 DIAGNOSIS — D0359 Melanoma in situ of other part of trunk: Secondary | ICD-10-CM | POA: Diagnosis not present

## 2023-01-15 DIAGNOSIS — L814 Other melanin hyperpigmentation: Secondary | ICD-10-CM | POA: Diagnosis not present

## 2023-01-15 DIAGNOSIS — L821 Other seborrheic keratosis: Secondary | ICD-10-CM | POA: Diagnosis not present

## 2023-01-15 DIAGNOSIS — L648 Other androgenic alopecia: Secondary | ICD-10-CM | POA: Diagnosis not present

## 2023-01-15 DIAGNOSIS — D485 Neoplasm of uncertain behavior of skin: Secondary | ICD-10-CM | POA: Diagnosis not present

## 2023-02-03 DIAGNOSIS — M9903 Segmental and somatic dysfunction of lumbar region: Secondary | ICD-10-CM | POA: Diagnosis not present

## 2023-02-03 DIAGNOSIS — M9902 Segmental and somatic dysfunction of thoracic region: Secondary | ICD-10-CM | POA: Diagnosis not present

## 2023-02-03 DIAGNOSIS — M9904 Segmental and somatic dysfunction of sacral region: Secondary | ICD-10-CM | POA: Diagnosis not present

## 2023-02-03 DIAGNOSIS — M5408 Panniculitis affecting regions of neck and back, sacral and sacrococcygeal region: Secondary | ICD-10-CM | POA: Diagnosis not present

## 2023-02-04 DIAGNOSIS — L905 Scar conditions and fibrosis of skin: Secondary | ICD-10-CM | POA: Diagnosis not present

## 2023-02-04 DIAGNOSIS — D0359 Melanoma in situ of other part of trunk: Secondary | ICD-10-CM | POA: Diagnosis not present

## 2023-02-28 ENCOUNTER — Encounter: Payer: Self-pay | Admitting: Physician Assistant

## 2023-02-28 ENCOUNTER — Ambulatory Visit (INDEPENDENT_AMBULATORY_CARE_PROVIDER_SITE_OTHER): Payer: BC Managed Care – PPO | Admitting: Physician Assistant

## 2023-02-28 DIAGNOSIS — G4700 Insomnia, unspecified: Secondary | ICD-10-CM | POA: Diagnosis not present

## 2023-02-28 DIAGNOSIS — F4010 Social phobia, unspecified: Secondary | ICD-10-CM | POA: Diagnosis not present

## 2023-02-28 MED ORDER — ALPRAZOLAM 0.25 MG PO TABS: ORAL_TABLET | ORAL | 0 refills | Status: AC

## 2023-02-28 NOTE — Progress Notes (Signed)
 [facility] Med Check  Patient ID: Troy Mercer,  MRN: [id number]  PCP: Joycelyn Rua, MD  Date of Evaluation: 02/28/2023 Time spent:20 minutes  Chief Complaint:  Chief Complaint   Anxiety; Follow-up    HISTORY/CURRENT STATUS: HPI for routine annual exam.  In this past year he has been doing well.  He still has anxiety at times, more of a sense of being overwhelmed.  Not having panic attacks.  Xanax is still effective.  He takes it as sparingly as possible.  It just takes the edge off.  Patient is able to enjoy things.  Energy and motivation are good.  Work is going well.   No extreme sadness, tearfulness, or feelings of hopelessness.  Sleeps well most of the time. ADLs and personal hygiene are normal.  No change in memory.  Appetite has not changed.  Weight is stable.  Denies suicidal or homicidal thoughts.  Patient denies increased energy with decreased need for sleep, increased talkativeness, racing thoughts, impulsivity or risky behaviors, increased spending, increased libido, grandiosity, increased irritability or anger, paranoia, or hallucinations.  Denies dizziness, syncope, seizures, numbness, tingling, tremor, tics, unsteady gait, slurred speech, confusion. Denies muscle or joint pain, stiffness, or dystonia.  Individual Medical History/ Review of Systems: Changes? :No       Past medications for mental health diagnoses include: Zoloft caused insomnia and made him feel awful.   Allergies: Codeine and Penicillins  Current Medications:  Current Outpatient Medications:    Multiple Vitamins-Minerals (EMERGEN-C IMMUNE PO), Take by mouth., Disp: , Rfl:    ALPRAZolam (XANAX) 0.25 MG tablet, 1/2-2 po qd prn., Disp: 30 tablet, Rfl: 0   calcium carbonate (TUMS - DOSED IN MG ELEMENTAL CALCIUM) 500 MG chewable tablet, Chew 2 tablets by mouth 2 (two) times daily as needed for heartburn.  (Patient not taking: Reported on 02/28/2023), Disp: , Rfl:    Melatonin 5 MG CAPS, Take by  mouth. (Patient not taking: Reported on 02/28/2023), Disp: , Rfl:    naproxen sodium (ANAPROX) 220 MG tablet, Take 440 mg by mouth 2 (two) times daily as needed (for pain). (Patient not taking: Reported on 02/28/2023), Disp: , Rfl:  Medication Side Effects: none  Family Medical/ Social History: Changes? No  MENTAL HEALTH EXAM:  There were no vitals taken for this visit.There is no height or weight on file to calculate BMI.  General Appearance: Casual and Well Groomed  Eye Contact:  Good  Speech:  Clear and Coherent and Normal Rate  Volume:  Normal  Mood:  Euthymic  Affect:  Appropriate  Thought Process:  Goal Directed and Descriptions of Associations: Circumstantial  Orientation:  Full (Time, Place, and Person)  Thought Content: Logical   Suicidal Thoughts:  No  Homicidal Thoughts:  No  Memory:  WNL  Judgement:  Good  Insight:  Good  Psychomotor Activity:  Normal  Concentration:  Concentration: Good and Attention Span: Good  Recall:  Good  Fund of Knowledge: Good  Language: Good  Assets:  Communication Skills Desire for Improvement Financial Resources/Insurance Housing Transportation Vocational/Educational  ADL's:  Intact  Cognition: WNL  Prognosis:  Good   DIAGNOSES:    ICD-10-CM   1. Social anxiety disorder  F40.10     2. Insomnia, unspecified type  G47.00       Receiving Psychotherapy: Yes Elio Forget, [facility].  RECOMMENDATIONS:  PDMP was reviewed.  Xanax last filled 11/15/2021. I provided 20 minutes of face to face time during this encounter, including time spent before and after  the visit in records review, medical decision making, counseling pertinent to today's visit, and charting.   He is doing well on the Xanax and takes it sparingly.  We discussed adding an SSRI or BuSpar but since he does not need the Xanax often, it is not necessary to add 1 of those medications on.  Continue Xanax 0.25 mg, 1/2-1 p.o. daily as needed anxiety. Continue therapy with Elio Forget, [facility]. Return in 1 year.  Melony Overly, PA-C

## 2023-03-07 ENCOUNTER — Encounter: Payer: Self-pay | Admitting: Physician Assistant

## 2023-03-24 DIAGNOSIS — M5408 Panniculitis affecting regions of neck and back, sacral and sacrococcygeal region: Secondary | ICD-10-CM | POA: Diagnosis not present

## 2023-03-24 DIAGNOSIS — M9904 Segmental and somatic dysfunction of sacral region: Secondary | ICD-10-CM | POA: Diagnosis not present

## 2023-03-24 DIAGNOSIS — M9902 Segmental and somatic dysfunction of thoracic region: Secondary | ICD-10-CM | POA: Diagnosis not present

## 2023-03-24 DIAGNOSIS — M9903 Segmental and somatic dysfunction of lumbar region: Secondary | ICD-10-CM | POA: Diagnosis not present

## 2023-04-18 ENCOUNTER — Ambulatory Visit: Payer: Self-pay | Admitting: Family Medicine

## 2023-05-06 DIAGNOSIS — D485 Neoplasm of uncertain behavior of skin: Secondary | ICD-10-CM | POA: Diagnosis not present

## 2023-05-06 DIAGNOSIS — L821 Other seborrheic keratosis: Secondary | ICD-10-CM | POA: Diagnosis not present

## 2023-05-06 DIAGNOSIS — C4359 Malignant melanoma of other part of trunk: Secondary | ICD-10-CM | POA: Diagnosis not present

## 2023-05-06 DIAGNOSIS — D225 Melanocytic nevi of trunk: Secondary | ICD-10-CM | POA: Diagnosis not present

## 2023-05-06 DIAGNOSIS — D229 Melanocytic nevi, unspecified: Secondary | ICD-10-CM | POA: Diagnosis not present

## 2023-05-06 DIAGNOSIS — Z85820 Personal history of malignant melanoma of skin: Secondary | ICD-10-CM | POA: Diagnosis not present

## 2023-05-08 DIAGNOSIS — M9902 Segmental and somatic dysfunction of thoracic region: Secondary | ICD-10-CM | POA: Diagnosis not present

## 2023-05-08 DIAGNOSIS — M9903 Segmental and somatic dysfunction of lumbar region: Secondary | ICD-10-CM | POA: Diagnosis not present

## 2023-05-08 DIAGNOSIS — M9904 Segmental and somatic dysfunction of sacral region: Secondary | ICD-10-CM | POA: Diagnosis not present

## 2023-05-08 DIAGNOSIS — M5408 Panniculitis affecting regions of neck and back, sacral and sacrococcygeal region: Secondary | ICD-10-CM | POA: Diagnosis not present

## 2023-06-18 DIAGNOSIS — M9903 Segmental and somatic dysfunction of lumbar region: Secondary | ICD-10-CM | POA: Diagnosis not present

## 2023-06-18 DIAGNOSIS — M9902 Segmental and somatic dysfunction of thoracic region: Secondary | ICD-10-CM | POA: Diagnosis not present

## 2023-06-18 DIAGNOSIS — M5408 Panniculitis affecting regions of neck and back, sacral and sacrococcygeal region: Secondary | ICD-10-CM | POA: Diagnosis not present

## 2023-06-18 DIAGNOSIS — M9904 Segmental and somatic dysfunction of sacral region: Secondary | ICD-10-CM | POA: Diagnosis not present

## 2023-08-06 DIAGNOSIS — M9902 Segmental and somatic dysfunction of thoracic region: Secondary | ICD-10-CM | POA: Diagnosis not present

## 2023-08-06 DIAGNOSIS — M9903 Segmental and somatic dysfunction of lumbar region: Secondary | ICD-10-CM | POA: Diagnosis not present

## 2023-08-06 DIAGNOSIS — M9904 Segmental and somatic dysfunction of sacral region: Secondary | ICD-10-CM | POA: Diagnosis not present

## 2023-08-06 DIAGNOSIS — M5408 Panniculitis affecting regions of neck and back, sacral and sacrococcygeal region: Secondary | ICD-10-CM | POA: Diagnosis not present

## 2023-09-17 DIAGNOSIS — M9903 Segmental and somatic dysfunction of lumbar region: Secondary | ICD-10-CM | POA: Diagnosis not present

## 2023-09-17 DIAGNOSIS — M5408 Panniculitis affecting regions of neck and back, sacral and sacrococcygeal region: Secondary | ICD-10-CM | POA: Diagnosis not present

## 2023-09-17 DIAGNOSIS — M9904 Segmental and somatic dysfunction of sacral region: Secondary | ICD-10-CM | POA: Diagnosis not present

## 2023-09-17 DIAGNOSIS — M9902 Segmental and somatic dysfunction of thoracic region: Secondary | ICD-10-CM | POA: Diagnosis not present

## 2023-09-24 DIAGNOSIS — Z131 Encounter for screening for diabetes mellitus: Secondary | ICD-10-CM | POA: Diagnosis not present

## 2023-09-24 DIAGNOSIS — Z13220 Encounter for screening for lipoid disorders: Secondary | ICD-10-CM | POA: Diagnosis not present

## 2023-09-24 DIAGNOSIS — Z125 Encounter for screening for malignant neoplasm of prostate: Secondary | ICD-10-CM | POA: Diagnosis not present

## 2023-09-30 DIAGNOSIS — Z23 Encounter for immunization: Secondary | ICD-10-CM | POA: Diagnosis not present

## 2023-09-30 DIAGNOSIS — Z0000 Encounter for general adult medical examination without abnormal findings: Secondary | ICD-10-CM | POA: Diagnosis not present

## 2023-10-06 ENCOUNTER — Emergency Department (HOSPITAL_BASED_OUTPATIENT_CLINIC_OR_DEPARTMENT_OTHER)

## 2023-10-06 ENCOUNTER — Emergency Department (HOSPITAL_BASED_OUTPATIENT_CLINIC_OR_DEPARTMENT_OTHER)
Admission: EM | Admit: 2023-10-06 | Discharge: 2023-10-06 | Disposition: A | Discharge status: Home / Self Care | Source: Ambulatory Visit

## 2023-10-06 ENCOUNTER — Other Ambulatory Visit: Payer: Self-pay

## 2023-10-06 ENCOUNTER — Encounter (HOSPITAL_BASED_OUTPATIENT_CLINIC_OR_DEPARTMENT_OTHER): Payer: Self-pay | Admitting: Emergency Medicine

## 2023-10-06 DIAGNOSIS — K5792 Diverticulitis of intestine, part unspecified, without perforation or abscess without bleeding: Secondary | ICD-10-CM

## 2023-10-06 DIAGNOSIS — R109 Unspecified abdominal pain: Secondary | ICD-10-CM | POA: Diagnosis not present

## 2023-10-06 DIAGNOSIS — K5732 Diverticulitis of large intestine without perforation or abscess without bleeding: Secondary | ICD-10-CM | POA: Insufficient documentation

## 2023-10-06 DIAGNOSIS — R932 Abnormal findings on diagnostic imaging of liver and biliary tract: Secondary | ICD-10-CM | POA: Diagnosis not present

## 2023-10-06 DIAGNOSIS — R1032 Left lower quadrant pain: Secondary | ICD-10-CM | POA: Diagnosis not present

## 2023-10-06 LAB — URINALYSIS, ROUTINE W REFLEX MICROSCOPIC
Bilirubin Urine: NEGATIVE
Glucose, UA: NEGATIVE mg/dL
Hgb urine dipstick: NEGATIVE
Ketones, ur: NEGATIVE mg/dL
Leukocytes,Ua: NEGATIVE
Nitrite: NEGATIVE
Protein, ur: NEGATIVE mg/dL
Specific Gravity, Urine: 1.013 (ref 1.005–1.030)
pH: 6.5 (ref 5.0–8.0)

## 2023-10-06 LAB — COMPREHENSIVE METABOLIC PANEL WITH GFR
ALT: 20 U/L (ref 0–44)
AST: 18 U/L (ref 15–41)
Albumin: 4.6 g/dL (ref 3.5–5.0)
Alkaline Phosphatase: 89 U/L (ref 38–126)
Anion gap: 13 (ref 5–15)
BUN: 11 mg/dL (ref 6–20)
CO2: 24 mmol/L (ref 22–32)
Calcium: 9.8 mg/dL (ref 8.9–10.3)
Chloride: 103 mmol/L (ref 98–111)
Creatinine, Ser: 0.94 mg/dL (ref 0.61–1.24)
GFR, Estimated: >60 mL/min (ref >60)
Glucose, Bld: 114 mg/dL — ABNORMAL HIGH (ref 70–99)
Potassium: 4.3 mmol/L (ref 3.5–5.1)
Sodium: 139 mmol/L (ref 135–145)
Total Bilirubin: 1 mg/dL (ref 0.0–1.2)
Total Protein: 7.9 g/dL (ref 6.5–8.1)

## 2023-10-06 LAB — CBC
HCT: 43.2 % (ref 39.0–52.0)
Hemoglobin: 14.8 g/dL (ref 13.0–17.0)
MCH: 29.8 pg (ref 26.0–34.0)
MCHC: 34.3 g/dL (ref 30.0–36.0)
MCV: 86.9 fL (ref 80.0–100.0)
Platelets: 210 K/uL (ref 150–400)
RBC: 4.97 MIL/uL (ref 4.22–5.81)
RDW: 12.4 % (ref 11.5–15.5)
WBC: 9.7 K/uL (ref 4.0–10.5)
nRBC: 0 % (ref 0.0–0.2)

## 2023-10-06 LAB — LIPASE, BLOOD: Lipase: 18 U/L (ref 11–51)

## 2023-10-06 MED ORDER — METRONIDAZOLE 500 MG PO TABS: 500.0000 mg | ORAL_TABLET | Freq: Two times a day (BID) | ORAL | 0 refills | Status: AC

## 2023-10-06 MED ORDER — IOHEXOL 300 MG/ML  SOLN
100.0000 mL | Freq: Once | INTRAMUSCULAR | Status: AC | PRN
  Administered 2023-10-06: 100 mL via INTRAVENOUS

## 2023-10-06 MED ORDER — HYDROCODONE-ACETAMINOPHEN 5-325 MG PO TABS: 1.0000 | ORAL_TABLET | ORAL | 0 refills | Status: AC | PRN

## 2023-10-06 MED ORDER — CIPROFLOXACIN HCL 500 MG PO TABS: 500.0000 mg | ORAL_TABLET | Freq: Two times a day (BID) | ORAL | 0 refills | Status: AC

## 2023-10-06 MED ORDER — MORPHINE SULFATE (PF) 4 MG/ML IV SOLN
4.0000 mg | Freq: Once | INTRAVENOUS | Status: AC
  Administered 2023-10-06: 4 mg via INTRAVENOUS
  Filled 2023-10-06: qty 1

## 2023-10-06 MED ORDER — SODIUM CHLORIDE 0.9 % IV BOLUS
1000.0000 mL | Freq: Once | INTRAVENOUS | Status: AC
  Administered 2023-10-06: 1000 mL via INTRAVENOUS

## 2023-10-06 MED ORDER — ONDANSETRON HCL 4 MG/2ML IJ SOLN
4.0000 mg | Freq: Once | INTRAMUSCULAR | Status: AC
  Administered 2023-10-06: 4 mg via INTRAVENOUS
  Filled 2023-10-06: qty 2

## 2023-10-06 MED ORDER — CIPROFLOXACIN HCL 500 MG PO TABS
500.0000 mg | ORAL_TABLET | Freq: Once | ORAL | Status: AC
  Administered 2023-10-06: 500 mg via ORAL
  Filled 2023-10-06: qty 1

## 2023-10-06 MED ORDER — METRONIDAZOLE 500 MG PO TABS
500.0000 mg | ORAL_TABLET | Freq: Once | ORAL | Status: AC
  Administered 2023-10-06: 500 mg via ORAL
  Filled 2023-10-06: qty 1

## 2023-10-06 NOTE — ED Provider Notes (Signed)
 [facility] AT [facility] Provider Note   CSN: [id number] Arrival date & time: 10/06/23  1000     Patient presents with: Abdominal Pain  HPI Troy Mercer is a 53 y.o. male presenting for lower abdominal pain.  H/O appendectomy.  Started last night.  Pain is in the left lower abdomen but does radiate centrally.  Denies nausea vomiting diarrhea. Did report some pain with urination but no blood in the urine.  Denies fever.  Was advised to come here for further evaluation after being seen at urgent care.    Abdominal Pain      Prior to Admission medications   Medication Sig Start Date End Date Taking? Authorizing Provider  ciprofloxacin (CIPRO) 500 MG tablet Take 1 tablet (500 mg total) by mouth every 12 (twelve) hours for 7 days. 10/06/23 10/13/23 Yes Josede Cicero K, PA-C  HYDROcodone-acetaminophen  (NORCO/VICODIN) 5-325 MG tablet Take 1 tablet by mouth every 4 (four) hours as needed for up to 8 doses. 10/06/23  Yes Kris No K, PA-C  metroNIDAZOLE (FLAGYL) 500 MG tablet Take 1 tablet (500 mg total) by mouth 2 (two) times daily. 10/06/23  Yes Moxon Messler K, PA-C  ALPRAZolam  (XANAX ) 0.25 MG tablet 1/2-2 po qd prn. 02/28/23   Hurst, Teresa T, PA-C  calcium carbonate (TUMS - DOSED IN MG ELEMENTAL CALCIUM) 500 MG chewable tablet Chew 2 tablets by mouth 2 (two) times daily as needed for heartburn.  Patient not taking: Reported on 02/28/2023    [provider]  Melatonin 5 MG CAPS Take by mouth. Patient not taking: Reported on 02/28/2023    [provider]  Multiple Vitamins-Minerals (EMERGEN-C IMMUNE PO) Take by mouth.    [provider]  naproxen sodium (ANAPROX) 220 MG tablet Take 440 mg by mouth 2 (two) times daily as needed (for pain). Patient not taking: Reported on 02/28/2023    [provider]    Allergies: Codeine and Penicillins    Review of Systems  Gastrointestinal:  Positive for abdominal pain.     Updated Vital Signs BP (!) 143/98 (BP Location: Left Arm)   Pulse 84   Temp 98.1 F (36.7 C) (Oral)   Resp 16   Ht 5' 8 (1.727 m)   Wt 86.2 kg   SpO2 97%   BMI 28.89 kg/m   Physical Exam Vitals and nursing note reviewed.  HENT:     Head: Normocephalic and atraumatic.     Mouth/Throat:     Mouth: Mucous membranes are moist.  Eyes:     General:        Right eye: No discharge.        Left eye: No discharge.     Conjunctiva/sclera: Conjunctivae normal.  Cardiovascular:     Rate and Rhythm: Normal rate and regular rhythm.     Pulses: Normal pulses.     Heart sounds: Normal heart sounds.  Pulmonary:     Effort: Pulmonary effort is normal.     Breath sounds: Normal breath sounds.  Abdominal:     General: Abdomen is flat.     Palpations: Abdomen is soft.     Tenderness: There is abdominal tenderness in the suprapubic area and left lower quadrant.  Skin:    General: Skin is warm and dry.  Neurological:     General: No focal deficit present.  Psychiatric:        Mood and Affect: Mood normal.     (all labs ordered are listed, but  only abnormal results are displayed) Labs Reviewed  COMPREHENSIVE METABOLIC PANEL WITH GFR - Abnormal; Notable for the following components:      Result Value   Glucose, Bld 114 (*)    All other components within normal limits  LIPASE, BLOOD  CBC  URINALYSIS, ROUTINE W REFLEX MICROSCOPIC    EKG: None  Radiology: CT ABDOMEN PELVIS W CONTRAST Result Date: 10/06/2023 CLINICAL DATA:  Abdominal pain, acute, nonlocalized EXAM: CT ABDOMEN AND PELVIS WITH CONTRAST TECHNIQUE: Multidetector CT imaging of the abdomen and pelvis was performed using the standard protocol following bolus administration of intravenous contrast. RADIATION DOSE REDUCTION: This exam was performed according to the departmental dose-optimization program which includes automated exposure control, adjustment of the mA and/or kV according to patient size and/or use of  iterative reconstruction technique. CONTRAST:  OMNIPAQUE IOHEXOL 300 MG/ML  SOLN COMPARISON:  04/29/2016 FINDINGS: Lower chest: No focal airspace consolidation or pleural effusion.Posterior bibasilar dependent atelectasis. Hepatobiliary: No mass.Subcentimeter hypodensity in the left hepatic lobe (axial 19), too small to definitively characterize, but likely a small cyst or biliary hamartoma.No radiopaque stones or wall thickening of the gallbladder. No intrahepatic or extrahepatic biliary ductal dilation. The portal veins are patent. Pancreas: No mass or main ductal dilation. No peripancreatic inflammation or fluid collection. Spleen: Normal size. No mass. Adrenals/Urinary Tract: No adrenal masses. No renal mass. No nephrolithiasis or hydronephrosis. The urinary bladder is completely decompressed. Stomach/Bowel: The stomach is decompressed without focal abnormality. No small bowel wall thickening or inflammation. No small bowel obstruction.The appendix was not visualized, possibly surgically absent. Descending and sigmoid colonic diverticulosis. Moderate wall thickening with pericolonic inflammation along the distal sigmoid colon. No peridiverticular abscess. Vascular/Lymphatic: No aortic aneurysm. Scattered aortoiliac atherosclerosis. No intraabdominal or pelvic lymphadenopathy. Reproductive: No prostatomegaly.No free pelvic fluid. Other: No pneumoperitoneum or ascites. Musculoskeletal: No acute fracture or destructive lesion. Multilevel degenerative disc disease of the spine. IMPRESSION: Acute, uncomplicated diverticulitis of the distal sigmoid colon. No peridiverticular abscess or pneumoperitoneum. Electronically Signed   By: Rogelia Myers M.D.   On: 10/06/2023 15:57     Procedures   Medications Ordered in the ED  metroNIDAZOLE (FLAGYL) tablet 500 mg (has no administration in time range)  ciprofloxacin (CIPRO) tablet 500 mg (has no administration in time range)  sodium chloride  0.9 % bolus 1,000  mL (1,000 mLs Intravenous New Bag/Given 10/06/23 1515)  ondansetron  (ZOFRAN ) injection 4 mg (4 mg Intravenous Given 10/06/23 1518)  morphine (PF) 4 MG/ML injection 4 mg (4 mg Intravenous Given 10/06/23 1518)  iohexol (OMNIPAQUE) 300 MG/ML solution 100 mL (100 mLs Intravenous Contrast Given 10/06/23 1527)                                    Medical Decision Making Amount and/or Complexity of Data Reviewed Labs: ordered. Radiology: ordered.  Risk Prescription drug management.   Initial Impression and Ddx 53 year old well-appearing male presenting for abdominal pain.  Exam notable for left lower quadrant suprapubic tenderness.  DDx includes kidney stone, pyelonephritis, diverticulitis, UTI, colitis, bowel obstruction, other. Patient PMH that increases complexity of ED encounter:  none  Interpretation of Diagnostics I independent reviewed and interpreted the labs as followed: none  - I independently visualized the following imaging with scope of interpretation limited to determining acute life threatening conditions related to emergency care: CT, which revealed acute uncomplicated diverticulitis  Patient Reassessment and Ultimate Disposition/Management On reassess, pain was improved.  Workup revealing  acute uncomplicated diverticulitis. Started him on Cipro and Flagyl as he does have an allergy to penicillins.  Sent a 7-day course to his pharmacy.  Sent a few tablets of Norco for pain.  Advised him to follow-up with his PCP.  Fluid challenge with no issue.  Discussed return precautions.  Discharged in good condition.  Patient management required discussion with the following services or consulting groups:  None  Complexity of Problems Addressed Acute complicated illness or Injury  Additional Data Reviewed and Analyzed Further history obtained from: Past medical history and medications listed in the EMR and Prior ED visit notes  Patient Encounter Risk Assessment Consideration of  hospitalization      Final diagnoses:  Diverticulitis    ED Discharge Orders          Ordered    metroNIDAZOLE (FLAGYL) 500 MG tablet  2 times daily        10/06/23 1607    ciprofloxacin (CIPRO) 500 MG tablet  Every 12 hours        10/06/23 1607    HYDROcodone-acetaminophen  (NORCO/VICODIN) 5-325 MG tablet  Every 4 hours PRN        10/06/23 1618               Lang Norleen POUR, PA-C 10/06/23 1624    Neysa Caron PARAS, DO 10/06/23 2350

## 2023-10-06 NOTE — ED Triage Notes (Signed)
 Low abd pain since yesterday. Kept him up all night. Denies N/V/D. Sent from [facility].

## 2023-10-06 NOTE — Discharge Instructions (Addendum)
 Evaluation revealed that you have diverticulitis.  Treatment is antibiotics.  You will be on Flagyl and Cipro.  Please follow-up with your PCP.  Also sent a few tablets of pain medicine to your pharmacy as well.  If you have inability to tolerate fluid intake, develop a fever, bloody stools or any other concerning symptom please return to the ED for further evaluation.

## 2023-10-18 ENCOUNTER — Other Ambulatory Visit: Payer: Self-pay | Admitting: Family Medicine

## 2023-10-22 DIAGNOSIS — Z6828 Body mass index (BMI) 28.0-28.9, adult: Secondary | ICD-10-CM | POA: Diagnosis not present

## 2023-10-22 DIAGNOSIS — K5792 Diverticulitis of intestine, part unspecified, without perforation or abscess without bleeding: Secondary | ICD-10-CM | POA: Diagnosis not present

## 2023-10-29 DIAGNOSIS — M9902 Segmental and somatic dysfunction of thoracic region: Secondary | ICD-10-CM | POA: Diagnosis not present

## 2023-10-29 DIAGNOSIS — M5408 Panniculitis affecting regions of neck and back, sacral and sacrococcygeal region: Secondary | ICD-10-CM | POA: Diagnosis not present

## 2023-10-29 DIAGNOSIS — M9903 Segmental and somatic dysfunction of lumbar region: Secondary | ICD-10-CM | POA: Diagnosis not present

## 2023-10-29 DIAGNOSIS — M9904 Segmental and somatic dysfunction of sacral region: Secondary | ICD-10-CM | POA: Diagnosis not present

## 2023-11-21 DIAGNOSIS — K5732 Diverticulitis of large intestine without perforation or abscess without bleeding: Secondary | ICD-10-CM | POA: Diagnosis not present

## 2023-11-21 DIAGNOSIS — R1030 Lower abdominal pain, unspecified: Secondary | ICD-10-CM | POA: Diagnosis not present

## 2023-12-15 DIAGNOSIS — K5732 Diverticulitis of large intestine without perforation or abscess without bleeding: Secondary | ICD-10-CM | POA: Diagnosis not present

## 2023-12-15 DIAGNOSIS — D123 Benign neoplasm of transverse colon: Secondary | ICD-10-CM | POA: Diagnosis not present

## 2023-12-15 DIAGNOSIS — K5730 Diverticulosis of large intestine without perforation or abscess without bleeding: Secondary | ICD-10-CM | POA: Diagnosis not present

## 2023-12-19 DIAGNOSIS — M9904 Segmental and somatic dysfunction of sacral region: Secondary | ICD-10-CM | POA: Diagnosis not present

## 2023-12-19 DIAGNOSIS — M9902 Segmental and somatic dysfunction of thoracic region: Secondary | ICD-10-CM | POA: Diagnosis not present

## 2023-12-19 DIAGNOSIS — M5408 Panniculitis affecting regions of neck and back, sacral and sacrococcygeal region: Secondary | ICD-10-CM | POA: Diagnosis not present

## 2023-12-19 DIAGNOSIS — M9903 Segmental and somatic dysfunction of lumbar region: Secondary | ICD-10-CM | POA: Diagnosis not present

## 2024-03-05 ENCOUNTER — Ambulatory Visit: Payer: BC Managed Care – PPO | Admitting: Physician Assistant
